# Patient Record
Sex: Male | Born: 1960
Health system: Southern US, Community
[De-identification: ages and names within clinical notes are randomized; demographics above are authoritative.]

## PROBLEM LIST (undated history)

## (undated) DIAGNOSIS — M5136 Other intervertebral disc degeneration, lumbar region: Secondary | ICD-10-CM

## (undated) DIAGNOSIS — B029 Zoster without complications: Secondary | ICD-10-CM

## (undated) DIAGNOSIS — J329 Chronic sinusitis, unspecified: Secondary | ICD-10-CM

## (undated) DIAGNOSIS — G4730 Sleep apnea, unspecified: Secondary | ICD-10-CM

## (undated) DIAGNOSIS — R059 Cough, unspecified: Secondary | ICD-10-CM

## (undated) DIAGNOSIS — U071 COVID-19: Secondary | ICD-10-CM

## (undated) DIAGNOSIS — G4733 Obstructive sleep apnea (adult) (pediatric): Secondary | ICD-10-CM

## (undated) DIAGNOSIS — I519 Heart disease, unspecified: Secondary | ICD-10-CM

## (undated) DIAGNOSIS — N200 Calculus of kidney: Secondary | ICD-10-CM

## (undated) DIAGNOSIS — K760 Fatty (change of) liver, not elsewhere classified: Secondary | ICD-10-CM

## (undated) DIAGNOSIS — Z7289 Other problems related to lifestyle: Secondary | ICD-10-CM

## (undated) DIAGNOSIS — G4700 Insomnia, unspecified: Secondary | ICD-10-CM

## (undated) DIAGNOSIS — H18519 Endothelial corneal dystrophy, unspecified eye: Secondary | ICD-10-CM

## (undated) DIAGNOSIS — E7800 Pure hypercholesterolemia, unspecified: Secondary | ICD-10-CM

## (undated) DIAGNOSIS — R7989 Other specified abnormal findings of blood chemistry: Secondary | ICD-10-CM

## (undated) DIAGNOSIS — M4316 Spondylolisthesis, lumbar region: Secondary | ICD-10-CM

## (undated) DIAGNOSIS — K5900 Constipation, unspecified: Secondary | ICD-10-CM

## (undated) DIAGNOSIS — Z789 Other specified health status: Secondary | ICD-10-CM

## (undated) DIAGNOSIS — K7581 Nonalcoholic steatohepatitis (NASH): Secondary | ICD-10-CM

## (undated) HISTORY — PX: TENDON REPAIR: SHX5111

## (undated) HISTORY — DX: Other problems related to lifestyle: Z72.89

## (undated) HISTORY — DX: Endothelial corneal dystrophy, unspecified eye: H18.519

## (undated) HISTORY — DX: Fatty (change of) liver, not elsewhere classified: K76.0

## (undated) HISTORY — DX: Cough, unspecified: R05.9

## (undated) HISTORY — DX: Other specified abnormal findings of blood chemistry: R79.89

## (undated) HISTORY — DX: Heart disease, unspecified: I51.9

## (undated) HISTORY — DX: Pure hypercholesterolemia, unspecified: E78.00

## (undated) HISTORY — DX: Zoster without complications: B02.9

## (undated) HISTORY — DX: Calculus of kidney: N20.0

## (undated) HISTORY — DX: Other intervertebral disc degeneration, lumbar region: M51.36

## (undated) HISTORY — DX: Other specified health status: Z78.9

## (undated) HISTORY — DX: Obstructive sleep apnea (adult) (pediatric): G47.33

## (undated) HISTORY — DX: COVID-19: U07.1

## (undated) HISTORY — DX: Spondylolisthesis, lumbar region: M43.16

## (undated) HISTORY — DX: Nonalcoholic steatohepatitis (NASH): K75.81

## (undated) HISTORY — DX: Chronic sinusitis, unspecified: J32.9

## (undated) HISTORY — DX: Insomnia, unspecified: G47.00

## (undated) HISTORY — DX: Constipation, unspecified: K59.00

---

## 2000-09-15 ENCOUNTER — Encounter: Admission: RE | Admit: 2000-09-15 | Discharge: 2000-09-15 | Payer: Self-pay | Admitting: Occupational Medicine

## 2000-09-15 ENCOUNTER — Encounter: Payer: Self-pay | Admitting: Occupational Medicine

## 2002-09-27 ENCOUNTER — Encounter: Admission: RE | Admit: 2002-09-27 | Discharge: 2002-09-27 | Payer: Self-pay | Admitting: Family Medicine

## 2002-09-27 ENCOUNTER — Encounter: Payer: Self-pay | Admitting: Family Medicine

## 2004-09-16 ENCOUNTER — Encounter: Admission: RE | Admit: 2004-09-16 | Discharge: 2004-09-16 | Payer: Self-pay | Admitting: Occupational Medicine

## 2006-08-17 ENCOUNTER — Encounter: Admission: RE | Admit: 2006-08-17 | Discharge: 2006-08-17 | Payer: Self-pay | Admitting: Occupational Medicine

## 2006-08-23 ENCOUNTER — Emergency Department (HOSPITAL_COMMUNITY): Admission: EM | Admit: 2006-08-23 | Discharge: 2006-08-23 | Payer: Self-pay | Admitting: Family Medicine

## 2006-11-19 ENCOUNTER — Ambulatory Visit (HOSPITAL_BASED_OUTPATIENT_CLINIC_OR_DEPARTMENT_OTHER): Admission: RE | Admit: 2006-11-19 | Discharge: 2006-11-19 | Payer: Self-pay | Admitting: Urology

## 2006-11-22 ENCOUNTER — Ambulatory Visit (HOSPITAL_COMMUNITY): Admission: RE | Admit: 2006-11-22 | Discharge: 2006-11-22 | Payer: Self-pay | Admitting: Urology

## 2008-08-13 ENCOUNTER — Encounter: Admission: RE | Admit: 2008-08-13 | Discharge: 2008-08-13 | Payer: Self-pay | Admitting: Internal Medicine

## 2010-07-22 NOTE — Op Note (Signed)
NAMEEDWING, FIGLEY           ACCOUNT NO.:  [id number]   MEDICAL RECORD NO.:  [id number]          PATIENT TYPE:  AMB   LOCATION:  [facility]                         FACILITY:  [facility]   PHYSICIAN:  Sigmund I. Patsi Sears, M.D.DATE OF BIRTH:  Jun 05, 1960   DATE OF PROCEDURE:  11/19/2006  DATE OF DISCHARGE:                               OPERATIVE REPORT   PREOPERATIVE DIAGNOSIS:  Impacted left upper ureteral calculus.   POSTOPERATIVE DIAGNOSIS:  Impacted left upper ureteral calculus.   OPERATION:  1. Cystourethroscopy.  2. Left retrograde pyelogram with interpretation.  3. Left double-J catheter (5-French x 24 -m Polaris).   SURGEON:  Sigmund I. Patsi Sears, M.D.   ANESTHESIA:  General LMA.   PREPARATION:  After appropriate preanesthesia, the patient is brought to  the operating room and placed on the operating room in the dorsal supine  position, where general LMA anesthesia was introduced.  He was then  replaced in dorsal lithotomy position, where the pubis was prepped with  Betadine solution and draped in the usual fashion.   REVIEW OF HISTORY:  This 50 year old male has a history of a left upper  ureteral calculus but has had recurrent and continuous kidney colic  overnight and is now for a left double-J catheter insertion.  KUB showed  the 4-5 mm stone, irregularly shaped, in the left upper ureter.   PROCEDURE:  The cystourethroscopy was accomplished and shows a normal-  appearing bladder without evidence of bladder stone, tumor or  diverticular formation.  There was no edema of the orifice.  Left  retrograde pyelograms were therefore performed, which shows a stone in  the left upper ureter with hydronephrosis above it.  Photo documentation  is obtained.  Following this, a guidewire was passed around stone into  the renal pelvis and a 5-French x 24-cm Polaris double-J stent is passed  into the renal pelvis and the bladder.  The stent is in excellent  position.  KUB shows the  stone to now be in the renal pelvis, coiled in  the center of the double-J catheter curl.  The patient tolerated the  procedure well.  He was given a B&O suppository and Toradol at the end  of the procedure.  He is awakened and taken to the recovery room in good  condition.      Sigmund I. Patsi Sears, M.D.  Electronically Signed     SIT/MEDQ  D:  11/19/2006  T:  11/20/2006  Job:  [id number]

## 2010-12-19 LAB — POCT HEMOGLOBIN-HEMACUE
Hemoglobin: 16
Operator id: 11453

## 2010-12-24 LAB — POCT URINALYSIS DIP (DEVICE)
Glucose, UA: NEGATIVE
Ketones, ur: NEGATIVE
Nitrite: NEGATIVE
Operator id: 235561
Protein, ur: 30 — AB
Specific Gravity, Urine: 1.025
Urobilinogen, UA: 0.2
pH: 6

## 2012-01-27 ENCOUNTER — Other Ambulatory Visit: Payer: Self-pay | Admitting: Neurosurgery

## 2012-01-27 DIAGNOSIS — M549 Dorsalgia, unspecified: Secondary | ICD-10-CM

## 2012-01-27 DIAGNOSIS — M5410 Radiculopathy, site unspecified: Secondary | ICD-10-CM

## 2012-02-05 ENCOUNTER — Ambulatory Visit
Admission: RE | Admit: 2012-02-05 | Discharge: 2012-02-05 | Disposition: A | Discharge status: Home / Self Care | Payer: BC Managed Care – PPO | Source: Ambulatory Visit | Attending: Neurosurgery | Admitting: Neurosurgery

## 2012-02-05 VITALS — BP 128/74 | HR 69

## 2012-02-05 DIAGNOSIS — M5410 Radiculopathy, site unspecified: Secondary | ICD-10-CM

## 2012-02-05 DIAGNOSIS — M549 Dorsalgia, unspecified: Secondary | ICD-10-CM

## 2012-02-05 MED ORDER — METHYLPREDNISOLONE ACETATE 40 MG/ML INJ SUSP (RADIOLOG
120.0000 mg | Freq: Once | INTRAMUSCULAR | Status: AC
  Administered 2012-02-05: 120 mg via EPIDURAL

## 2012-02-05 MED ORDER — IOHEXOL 180 MG/ML  SOLN
1.0000 mL | Freq: Once | INTRAMUSCULAR | Status: AC | PRN
  Administered 2012-02-05: 1 mL via EPIDURAL

## 2012-09-04 ENCOUNTER — Encounter (HOSPITAL_COMMUNITY): Payer: Self-pay | Admitting: Emergency Medicine

## 2012-09-04 ENCOUNTER — Emergency Department (INDEPENDENT_AMBULATORY_CARE_PROVIDER_SITE_OTHER): Payer: BC Managed Care – PPO

## 2012-09-04 ENCOUNTER — Emergency Department (INDEPENDENT_AMBULATORY_CARE_PROVIDER_SITE_OTHER)
Admission: EM | Admit: 2012-09-04 | Discharge: 2012-09-04 | Disposition: A | Discharge status: Home / Self Care | Payer: BC Managed Care – PPO | Source: Home / Self Care | Attending: Emergency Medicine | Admitting: Emergency Medicine

## 2012-09-04 DIAGNOSIS — R52 Pain, unspecified: Secondary | ICD-10-CM

## 2012-09-04 DIAGNOSIS — J40 Bronchitis, not specified as acute or chronic: Secondary | ICD-10-CM

## 2012-09-04 DIAGNOSIS — N200 Calculus of kidney: Secondary | ICD-10-CM

## 2012-09-04 DIAGNOSIS — R109 Unspecified abdominal pain: Secondary | ICD-10-CM

## 2012-09-04 HISTORY — DX: Calculus of kidney: N20.0

## 2012-09-04 LAB — POCT I-STAT, CHEM 8
BUN: 24 mg/dL — ABNORMAL HIGH (ref 6–23)
Calcium, Ion: 1.17 mmol/L (ref 1.12–1.23)
Chloride: 100 mEq/L (ref 96–112)
Creatinine, Ser: 1.1 mg/dL (ref 0.50–1.35)
Glucose, Bld: 90 mg/dL (ref 70–99)
HCT: 44 % (ref 39.0–52.0)
Hemoglobin: 15 g/dL (ref 13.0–17.0)
Potassium: 4 mEq/L (ref 3.5–5.1)
Sodium: 140 mEq/L (ref 135–145)
TCO2: 28 mmol/L (ref 0–100)

## 2012-09-04 LAB — POCT URINALYSIS DIP (DEVICE)
Bilirubin Urine: NEGATIVE
Glucose, UA: NEGATIVE mg/dL
Ketones, ur: NEGATIVE mg/dL
Leukocytes, UA: NEGATIVE
Nitrite: NEGATIVE
Protein, ur: 30 mg/dL — AB
Specific Gravity, Urine: >=1.03 (ref 1.005–1.030)
Urobilinogen, UA: 0.2 mg/dL (ref 0.0–1.0)
pH: 6 (ref 5.0–8.0)

## 2012-09-04 MED ORDER — HYDROCODONE-ACETAMINOPHEN 5-325 MG PO TABS: ORAL_TABLET | ORAL | Status: DC

## 2012-09-04 MED ORDER — CEFDINIR 300 MG PO CAPS: 300.0000 mg | ORAL_CAPSULE | Freq: Two times a day (BID) | ORAL | Status: DC

## 2012-09-04 NOTE — ED Notes (Signed)
No instructions.

## 2012-09-04 NOTE — ED Notes (Signed)
Pain in flank/front of torso.  History of kidney stones.  Reports same pain presentation when he had previous kidney stone, but on the left side.  "bladder" pain per patient.   Onset this am at 4:00 am.  Associated with nausea this am.

## 2012-09-04 NOTE — ED Notes (Signed)
MD at bedside. 

## 2012-09-04 NOTE — ED Provider Notes (Signed)
History    CSN: [id number] Arrival date & time 09/04/12  1130  First MD Initiated Contact with Patient 09/04/12 1322     Chief Complaint  Patient presents with  . Flank Pain  PATIENT WOKE UP WITH SEVERE RIGHT FLANK PAIN AT 4 AM TODAY ASSOCIATED URINARY FREQUENCY DENIES FEVER, VOMITING,HEMATURIA HISTORY KIDNEY STONE WHICH HE PASSED 6 YEARS AGO  (Consider location/radiation/quality/duration/timing/severity/associated sxs/prior Treatment) No language interpreter was used.   Past Medical History  Diagnosis Date  . Kidney stone    Past Surgical History  Procedure Laterality Date  . Tendon repair Right     arm   No family history on file. History  Substance Use Topics  . Smoking status: Never Smoker   . Smokeless tobacco: Not on file  . Alcohol Use: Yes    Review of Systems  Constitutional: Negative.   HENT: Negative.   Eyes: Negative.   Respiratory: Positive for cough.   Cardiovascular: Negative.   Gastrointestinal: Positive for abdominal pain.       C/O RIGHT FLANK PAIN  Endocrine: Negative.   Genitourinary: Positive for urgency.  Musculoskeletal: Negative.   Neurological: Negative.   Hematological: Negative.   Psychiatric/Behavioral: Negative.   All other systems reviewed and are negative.    Allergies  Review of patient's allergies indicates no known allergies.  Home Medications   Current Outpatient Rx  Name  Route  Sig  Dispense  Refill  . cefdinir (OMNICEF) 300 MG capsule   Oral   Take 1 capsule (300 mg total) by mouth 2 (two) times daily.   14 capsule   0   . HYDROcodone-acetaminophen (NORCO/VICODIN) 5-325 MG per tablet      1 TO 2 TABS EVERY 4 HOURS PAIN AS NEEDED   15 tablet   0    BP 133/89  Pulse 70  Temp(Src) 97.7 F (36.5 C) (Oral)  Resp 20  SpO2 98% Physical Exam  Nursing note and vitals reviewed. Constitutional: He is oriented to person, place, and time. He appears well-developed and well-nourished.  HENT:  Head:  Normocephalic and atraumatic.  Mouth/Throat: Oropharynx is clear and moist.  Eyes: Conjunctivae are normal. Pupils are equal, round, and reactive to light.  Neck: Normal range of motion. Neck supple.  Cardiovascular: Normal rate, regular rhythm, normal heart sounds and intact distal pulses.   No murmur heard. Pulmonary/Chest: Effort normal and breath sounds normal.  Abdominal: Soft. Bowel sounds are normal. He exhibits no distension and no mass. There is no tenderness.  RIGHT LOWER BACK TENDERNESS NO SWELLING, RASH  Musculoskeletal: Normal range of motion.  Neurological: He is alert and oriented to person, place, and time. No cranial nerve deficit. He exhibits normal muscle tone. Coordination normal.  Skin: Skin is warm and dry.  Psychiatric: He has a normal mood and affect.    ED Course  Procedures (including critical care time) Labs Reviewed  POCT URINALYSIS DIP (DEVICE) - Abnormal; Notable for the following:    Hgb urine dipstick LARGE (*)    Protein, ur 30 (*)    All other components within normal limits  POCT I-STAT, CHEM 8 - Abnormal; Notable for the following:    BUN 24 (*)    All other components within normal limits   Dg Chest 2 View  09/04/2012   *RADIOLOGY REPORT*  Clinical Data: Cough for 2 weeks.  CHEST - 2 VIEW  Comparison: 08/13/2008.  Findings: The heart, mediastinum and hilar contours are normal. The lungs are clear.  There  are no effusions or pneumothoraces. There are no acute bony changes.  IMPRESSION: No active disease   Original Report Authenticated By: Sander Radon, M.D.   1. Acute right flank pain   2. Kidney stone on right side   3. Bronchitis     MDM  Patient claims his previous kidney stone had to be crushed  He agreed to defer CT stone study at this time since pain has resolved Will observe if stone passes spontaneously His present symptoms are consistent with ureteral stone and colic He has microscopic hematuria; renal function normal Advised  to see urologist in 2 days for recheck To go to ER if recurrent and persistent pain  Vara Guardian Marcello Moores, MD 09/05/12 [phone]

## 2012-09-04 NOTE — ED Notes (Signed)
Triage documentation interrupted by department acuity

## 2012-09-04 NOTE — ED Notes (Signed)
Provided with urinary strainer

## 2012-09-15 ENCOUNTER — Ambulatory Visit: Payer: Self-pay

## 2012-09-15 ENCOUNTER — Other Ambulatory Visit: Payer: Self-pay | Admitting: Occupational Medicine

## 2012-09-15 DIAGNOSIS — Z0000 Encounter for general adult medical examination without abnormal findings: Secondary | ICD-10-CM

## 2012-10-20 ENCOUNTER — Other Ambulatory Visit: Payer: Self-pay | Admitting: Neurosurgery

## 2012-10-20 DIAGNOSIS — M549 Dorsalgia, unspecified: Secondary | ICD-10-CM

## 2012-10-26 ENCOUNTER — Other Ambulatory Visit: Payer: Self-pay

## 2012-11-04 ENCOUNTER — Other Ambulatory Visit: Payer: Self-pay

## 2012-11-04 ENCOUNTER — Ambulatory Visit
Admission: RE | Admit: 2012-11-04 | Discharge: 2012-11-04 | Disposition: A | Discharge status: Home / Self Care | Payer: BC Managed Care – PPO | Source: Ambulatory Visit | Attending: Neurosurgery | Admitting: Neurosurgery

## 2012-11-04 VITALS — BP 143/72 | HR 68

## 2012-11-04 DIAGNOSIS — M549 Dorsalgia, unspecified: Secondary | ICD-10-CM

## 2012-11-04 MED ORDER — METHYLPREDNISOLONE ACETATE 40 MG/ML INJ SUSP (RADIOLOG
120.0000 mg | Freq: Once | INTRAMUSCULAR | Status: AC
  Administered 2012-11-04: 120 mg via EPIDURAL

## 2012-11-04 MED ORDER — IOHEXOL 180 MG/ML  SOLN
1.0000 mL | Freq: Once | INTRAMUSCULAR | Status: AC | PRN
  Administered 2012-11-04: 1 mL via EPIDURAL

## 2013-01-12 ENCOUNTER — Other Ambulatory Visit: Payer: Self-pay

## 2013-09-19 ENCOUNTER — Other Ambulatory Visit: Payer: Self-pay | Admitting: Internal Medicine

## 2013-09-19 ENCOUNTER — Ambulatory Visit
Admission: RE | Admit: 2013-09-19 | Discharge: 2013-09-19 | Disposition: A | Discharge status: Home / Self Care | Payer: BC Managed Care – PPO | Source: Ambulatory Visit | Attending: Internal Medicine | Admitting: Internal Medicine

## 2013-09-19 DIAGNOSIS — R059 Cough, unspecified: Secondary | ICD-10-CM

## 2013-09-19 DIAGNOSIS — R05 Cough: Secondary | ICD-10-CM

## 2014-10-04 ENCOUNTER — Other Ambulatory Visit: Payer: Self-pay | Admitting: Occupational Medicine

## 2014-10-04 ENCOUNTER — Ambulatory Visit: Payer: Self-pay

## 2014-10-04 DIAGNOSIS — Z0000 Encounter for general adult medical examination without abnormal findings: Secondary | ICD-10-CM

## 2015-05-07 ENCOUNTER — Other Ambulatory Visit: Payer: Self-pay | Admitting: Neurosurgery

## 2015-05-07 DIAGNOSIS — M549 Dorsalgia, unspecified: Secondary | ICD-10-CM

## 2015-05-13 ENCOUNTER — Ambulatory Visit
Admission: RE | Admit: 2015-05-13 | Discharge: 2015-05-13 | Disposition: A | Discharge status: Home / Self Care | Payer: BLUE CROSS/BLUE SHIELD | Source: Ambulatory Visit | Attending: Neurosurgery | Admitting: Neurosurgery

## 2015-05-13 DIAGNOSIS — M549 Dorsalgia, unspecified: Secondary | ICD-10-CM

## 2015-05-13 MED ORDER — IOHEXOL 180 MG/ML  SOLN
1.0000 mL | Freq: Once | INTRAMUSCULAR | Status: AC | PRN
  Administered 2015-05-13: 1 mL via EPIDURAL

## 2015-05-13 MED ORDER — METHYLPREDNISOLONE ACETATE 40 MG/ML INJ SUSP (RADIOLOG
120.0000 mg | Freq: Once | INTRAMUSCULAR | Status: AC
  Administered 2015-05-13: 120 mg via EPIDURAL

## 2015-05-13 NOTE — Discharge Instructions (Signed)

## 2015-08-13 DIAGNOSIS — B029 Zoster without complications: Secondary | ICD-10-CM | POA: Diagnosis not present

## 2015-09-26 DIAGNOSIS — Z125 Encounter for screening for malignant neoplasm of prostate: Secondary | ICD-10-CM | POA: Diagnosis not present

## 2015-09-26 DIAGNOSIS — E663 Overweight: Secondary | ICD-10-CM | POA: Diagnosis not present

## 2015-09-26 DIAGNOSIS — Z0000 Encounter for general adult medical examination without abnormal findings: Secondary | ICD-10-CM | POA: Diagnosis not present

## 2015-11-26 DIAGNOSIS — M26609 Unspecified temporomandibular joint disorder, unspecified side: Secondary | ICD-10-CM | POA: Insufficient documentation

## 2016-04-15 DIAGNOSIS — L02439 Carbuncle of limb, unspecified: Secondary | ICD-10-CM | POA: Diagnosis not present

## 2016-04-29 DIAGNOSIS — J069 Acute upper respiratory infection, unspecified: Secondary | ICD-10-CM | POA: Diagnosis not present

## 2016-06-12 DIAGNOSIS — L02412 Cutaneous abscess of left axilla: Secondary | ICD-10-CM | POA: Diagnosis not present

## 2016-08-08 ENCOUNTER — Emergency Department (HOSPITAL_COMMUNITY)
Admission: EM | Admit: 2016-08-08 | Discharge: 2016-08-08 | Disposition: A | Discharge status: Home / Self Care | Payer: BLUE CROSS/BLUE SHIELD | Attending: Emergency Medicine | Admitting: Emergency Medicine

## 2016-08-08 ENCOUNTER — Emergency Department (HOSPITAL_COMMUNITY): Payer: BLUE CROSS/BLUE SHIELD

## 2016-08-08 ENCOUNTER — Encounter (HOSPITAL_COMMUNITY): Payer: Self-pay

## 2016-08-08 DIAGNOSIS — R1032 Left lower quadrant pain: Secondary | ICD-10-CM | POA: Diagnosis present

## 2016-08-08 DIAGNOSIS — N200 Calculus of kidney: Secondary | ICD-10-CM

## 2016-08-08 DIAGNOSIS — N132 Hydronephrosis with renal and ureteral calculous obstruction: Secondary | ICD-10-CM | POA: Diagnosis not present

## 2016-08-08 HISTORY — DX: Calculus of kidney: N20.0

## 2016-08-08 LAB — URINALYSIS, ROUTINE W REFLEX MICROSCOPIC
Bilirubin Urine: NEGATIVE
Bilirubin Urine: NEGATIVE
Glucose, UA: NEGATIVE mg/dL
Glucose, UA: NEGATIVE mg/dL
Ketones, ur: NEGATIVE mg/dL
Ketones, ur: NEGATIVE mg/dL
Leukocytes, UA: NEGATIVE
Leukocytes, UA: NEGATIVE
Nitrite: NEGATIVE
Nitrite: NEGATIVE
Protein, ur: 100 mg/dL — AB
Protein, ur: 100 mg/dL — AB
Specific Gravity, Urine: 1.02 (ref 1.005–1.030)
Specific Gravity, Urine: 1.02 (ref 1.005–1.030)
pH: 5 (ref 5.0–8.0)
pH: 5 (ref 5.0–8.0)

## 2016-08-08 LAB — URINALYSIS, MICROSCOPIC (REFLEX): Squamous Epithelial / LPF: NONE SEEN

## 2016-08-08 LAB — COMPREHENSIVE METABOLIC PANEL
ALT: 165 U/L — ABNORMAL HIGH (ref 17–63)
AST: 89 U/L — ABNORMAL HIGH (ref 15–41)
Albumin: 4.5 g/dL (ref 3.5–5.0)
Alkaline Phosphatase: 64 U/L (ref 38–126)
Anion gap: 7 (ref 5–15)
BUN: 21 mg/dL — ABNORMAL HIGH (ref 6–20)
CO2: 26 mmol/L (ref 22–32)
Calcium: 9.5 mg/dL (ref 8.9–10.3)
Chloride: 105 mmol/L (ref 101–111)
Creatinine, Ser: 1.04 mg/dL (ref 0.61–1.24)
GFR calc Af Amer: >60 mL/min (ref >60)
GFR calc non Af Amer: >60 mL/min (ref >60)
Glucose, Bld: 118 mg/dL — ABNORMAL HIGH (ref 65–99)
Potassium: 3.9 mmol/L (ref 3.5–5.1)
Sodium: 138 mmol/L (ref 135–145)
Total Bilirubin: 0.8 mg/dL (ref 0.3–1.2)
Total Protein: 8.1 g/dL (ref 6.5–8.1)

## 2016-08-08 LAB — CBC WITH DIFFERENTIAL/PLATELET
Basophils Absolute: 0 10*3/uL (ref 0.0–0.1)
Basophils Relative: 0 %
Eosinophils Absolute: 0.1 10*3/uL (ref 0.0–0.7)
Eosinophils Relative: 1 %
HCT: 42.1 % (ref 39.0–52.0)
Hemoglobin: 15.3 g/dL (ref 13.0–17.0)
Lymphocytes Relative: 17 %
Lymphs Abs: 1.6 10*3/uL (ref 0.7–4.0)
MCH: 32.6 pg (ref 26.0–34.0)
MCHC: 36.3 g/dL — ABNORMAL HIGH (ref 30.0–36.0)
MCV: 89.8 fL (ref 78.0–100.0)
Monocytes Absolute: 0.5 10*3/uL (ref 0.1–1.0)
Monocytes Relative: 5 %
Neutro Abs: 7.4 10*3/uL (ref 1.7–7.7)
Neutrophils Relative %: 77 %
Platelets: 198 10*3/uL (ref 150–400)
RBC: 4.69 MIL/uL (ref 4.22–5.81)
RDW: 12.2 % (ref 11.5–15.5)
WBC: 9.6 10*3/uL (ref 4.0–10.5)

## 2016-08-08 MED ORDER — HYDROMORPHONE HCL 1 MG/ML IJ SOLN
1.0000 mg | Freq: Two times a day (BID) | INTRAMUSCULAR | Status: DC | PRN
  Administered 2016-08-08: 1 mg via INTRAVENOUS
  Filled 2016-08-08: qty 1

## 2016-08-08 MED ORDER — ONDANSETRON HCL 4 MG/2ML IJ SOLN
4.0000 mg | Freq: Once | INTRAMUSCULAR | Status: AC
  Administered 2016-08-08: 4 mg via INTRAVENOUS
  Filled 2016-08-08: qty 2

## 2016-08-08 MED ORDER — SODIUM CHLORIDE 0.9 % IV BOLUS (SEPSIS)
1000.0000 mL | Freq: Once | INTRAVENOUS | Status: AC
  Administered 2016-08-08: 1000 mL via INTRAVENOUS

## 2016-08-08 MED ORDER — OXYCODONE-ACETAMINOPHEN 5-325 MG PO TABS
1.0000 | ORAL_TABLET | Freq: Once | ORAL | Status: AC
  Administered 2016-08-08: 1 via ORAL
  Filled 2016-08-08: qty 1

## 2016-08-08 MED ORDER — OXYCODONE-ACETAMINOPHEN 5-325 MG PO TABS
2.0000 | ORAL_TABLET | ORAL | 0 refills | Status: DC | PRN
Start: 2016-08-08 — End: 2017-03-19

## 2016-08-08 MED ORDER — ONDANSETRON 4 MG PO TBDP: 4.0000 mg | ORAL_TABLET | Freq: Three times a day (TID) | ORAL | 0 refills | Status: DC | PRN

## 2016-08-08 MED ORDER — TAMSULOSIN HCL 0.4 MG PO CAPS: 0.4000 mg | ORAL_CAPSULE | Freq: Every day | ORAL | 0 refills | Status: DC

## 2016-08-08 MED ORDER — TAMSULOSIN HCL 0.4 MG PO CAPS
0.4000 mg | ORAL_CAPSULE | Freq: Once | ORAL | Status: AC
  Administered 2016-08-08: 0.4 mg via ORAL
  Filled 2016-08-08: qty 1

## 2016-08-08 MED ORDER — KETOROLAC TROMETHAMINE 30 MG/ML IJ SOLN
30.0000 mg | Freq: Once | INTRAMUSCULAR | Status: AC
  Administered 2016-08-08: 30 mg via INTRAVENOUS
  Filled 2016-08-08: qty 1

## 2016-08-08 NOTE — ED Triage Notes (Signed)
Pt had sudden onset pain in lower pelvic/abdomen which radiates to groin. Nausea.  Pt states hx of kidney stones. Feels similar

## 2016-08-08 NOTE — ED Provider Notes (Signed)
[facility]-EMERGENCY DEPT Provider Note   CSN: [id number] Arrival date & time: 08/08/16  1744     History   Chief Complaint Chief Complaint  Patient presents with  . Groin Pain    HPI Wayne Forbes is a 56 y.o. male. Chief complaint is left flank and abdominal pain.  HPI: This is a 56 year old male who developed sudden onset of left flank and left mid abdominal pain at 3:00 this afternoon. Previous history of ureteral stones. Past one at home. Had lithotripsy and stent for the other. Nausea no vomiting. Crescendoing in the crescendoing pain. States his pain right now as a 6. Has been 10/10 at times today.  Past Medical History:  Diagnosis Date  . Kidney stone   . Kidney stones     There are no active problems to display for this patient.   Past Surgical History:  Procedure Laterality Date  . TENDON REPAIR Right    arm       Home Medications    Prior to Admission medications   Medication Sig Start Date End Date Taking? Authorizing Provider  cefdinir (OMNICEF) 300 MG capsule Take 1 capsule (300 mg total) by mouth 2 (two) times daily. 09/04/12   [place], Duwayne Heck, MD  HYDROcodone-acetaminophen (NORCO/VICODIN) 5-325 MG per tablet 1 TO 2 TABS EVERY 4 HOURS PAIN AS NEEDED 09/04/12   [place], Duwayne Heck, MD  ondansetron (ZOFRAN ODT) 4 MG disintegrating tablet Take 1 tablet (4 mg total) by mouth every 8 (eight) hours as needed for nausea. 08/08/16   Rolland Porter, MD  oxyCODONE-acetaminophen (PERCOCET/ROXICET) 5-325 MG tablet Take 2 tablets by mouth every 4 (four) hours as needed. 08/08/16   Rolland Porter, MD  tamsulosin (FLOMAX) 0.4 MG CAPS capsule Take 1 capsule (0.4 mg total) by mouth daily. 08/08/16   Rolland Porter, MD    Family History History reviewed. No pertinent family history.  Social History Social History  Substance Use Topics  . Smoking status: Never Smoker  . Smokeless tobacco: Never Used  . Alcohol use Yes     Allergies   Patient has no known  allergies.   Review of Systems Review of Systems  Constitutional: Negative for appetite change, chills, diaphoresis, fatigue and fever.  HENT: Negative for mouth sores, sore throat and trouble swallowing.   Eyes: Negative for visual disturbance.  Respiratory: Negative for cough, chest tightness, shortness of breath and wheezing.   Cardiovascular: Negative for chest pain.  Gastrointestinal: Positive for abdominal pain and nausea. Negative for abdominal distention, diarrhea and vomiting.  Endocrine: Negative for polydipsia, polyphagia and polyuria.  Genitourinary: Negative for dysuria, frequency and hematuria.  Musculoskeletal: Negative for gait problem.  Skin: Negative for color change, pallor and rash.  Neurological: Negative for dizziness, syncope, light-headedness and headaches.  Hematological: Does not bruise/bleed easily.  Psychiatric/Behavioral: Negative for behavioral problems and confusion.     Physical Exam Updated Vital Signs BP (!) 135/93 (BP Location: Left Arm)   Pulse 70   Temp 97.7 F (36.5 C) (Oral)   Resp 20   SpO2 100%   Physical Exam  Constitutional: He is oriented to person, place, and time. He appears well-developed and well-nourished. No distress.  HENT:  Head: Normocephalic.  Eyes: Conjunctivae are normal. Pupils are equal, round, and reactive to light. No scleral icterus.  Neck: Normal range of motion. Neck supple. No thyromegaly present.  Cardiovascular: Normal rate and regular rhythm.  Exam reveals no gallop and no friction rub.   No  murmur heard. Pulmonary/Chest: Effort normal and breath sounds normal. No respiratory distress. He has no wheezes. He has no rales.  Abdominal: Soft. Bowel sounds are normal. He exhibits no distension. There is no tenderness. There is no rebound.    Musculoskeletal: Normal range of motion.  Neurological: He is alert and oriented to person, place, and time.  Skin: Skin is warm and dry. No rash noted.  Psychiatric: He  has a normal mood and affect. His behavior is normal.     ED Treatments / Results  Labs (all labs ordered are listed, but only abnormal results are displayed) Labs Reviewed  URINALYSIS, ROUTINE W REFLEX MICROSCOPIC - Abnormal; Notable for the following:       Result Value   APPearance CLOUDY (*)    Hgb urine dipstick LARGE (*)    Protein, ur 100 (*)    All other components within normal limits  URINALYSIS, ROUTINE W REFLEX MICROSCOPIC - Abnormal; Notable for the following:    Color, Urine BROWN (*)    APPearance TURBID (*)    Hgb urine dipstick LARGE (*)    Protein, ur 100 (*)    All other components within normal limits  URINALYSIS, MICROSCOPIC (REFLEX) - Abnormal; Notable for the following:    Bacteria, UA MANY (*)    All other components within normal limits  CBC WITH DIFFERENTIAL/PLATELET - Abnormal; Notable for the following:    MCHC 36.3 (*)    All other components within normal limits  COMPREHENSIVE METABOLIC PANEL - Abnormal; Notable for the following:    Glucose, Bld 118 (*)    BUN 21 (*)    AST 89 (*)    ALT 165 (*)    All other components within normal limits    EKG  EKG Interpretation None       Radiology Ct Abdomen Pelvis Wo Contrast  Result Date: 08/08/2016 CLINICAL DATA:  Acute onset of lower pelvic and abdominal pain, radiating to the groin. Nausea. Initial encounter. EXAM: CT ABDOMEN AND PELVIS WITHOUT CONTRAST TECHNIQUE: Multidetector CT imaging of the abdomen and pelvis was performed following the standard protocol without IV contrast. COMPARISON:  CT of the abdomen and pelvis performed 09/06/2012, and lumbar spine MRI performed 04/20/2015 FINDINGS: Lower chest: The visualized lung bases are grossly clear. The visualized portions of the mediastinum are unremarkable. Hepatobiliary: The liver is unremarkable in appearance. The gallbladder is unremarkable in appearance. The common bile duct remains normal in caliber. Pancreas: The pancreas is within normal  limits. Spleen: Numerous calcified granulomata are seen within the spleen. Adrenals/Urinary Tract: There is minimal left-sided hydronephrosis, with mild left-sided perinephric stranding, and an obstructing 5 x 4 mm stone noted at the proximal left ureter, 4 cm below the left renal pelvis. Nonobstructing bilateral renal stones measure up to 6 mm in size. Minimal nonspecific right-sided perinephric stranding is noted. Stomach/Bowel: The stomach is unremarkable in appearance. The small bowel is within normal limits. The appendix is normal in caliber, without evidence of appendicitis. The colon is unremarkable in appearance. Vascular/Lymphatic: Minimal calcification is noted at the distal abdominal aorta. No retroperitoneal or pelvic sidewall lymphadenopathy is seen. Reproductive: The bladder is mildly distended and grossly unremarkable. The prostate remains normal in size, with minimal calcification. Other: No additional soft tissue abnormalities are seen. Musculoskeletal: No acute osseous abnormalities are identified. Vacuum phenomenon is noted at L5-S1, with mild grade 1 retrolisthesis of L5 on S1. The visualized musculature is unremarkable in appearance. IMPRESSION: 1. Minimal left-sided hydronephrosis, with obstructing 5  x 4 mm stone noted at the proximal left ureter, 4 cm below the left renal pelvis. 2. Nonobstructing bilateral renal stones measure up to 6 mm in size. Electronically Signed   By: Roanna RaiderJeffery  Chang M.D.   On: 08/08/2016 22:06    Procedures Procedures (including critical care time)  Medications Ordered in ED Medications  HYDROmorphone (DILAUDID) injection 1 mg (1 mg Intravenous Given 08/08/16 2124)  ketorolac (TORADOL) 30 MG/ML injection 30 mg (not administered)  oxyCODONE-acetaminophen (PERCOCET/ROXICET) 5-325 MG per tablet 1 tablet (not administered)  ondansetron (ZOFRAN) injection 4 mg (4 mg Intravenous Given 08/08/16 2124)  tamsulosin (FLOMAX) capsule 0.4 mg (0.4 mg Oral Given 08/08/16 2125)   sodium chloride 0.9 % bolus 1,000 mL (1,000 mLs Intravenous New Bag/Given 08/08/16 2124)     Initial Impression / Assessment and Plan / ED Course  I have reviewed the triage vital signs and the nursing notes.  Pertinent labs & imaging results that were available during my care of the patient were reviewed by me and considered in my medical decision making (see chart for details).     Urine shows hematuria. He has no fever, leukocytosis, or marked blood cell count of his urine. CT scan shows 4 x 5 mm left proximal ureteral stone. I displaced. Given IV fluids. His renal function is normal. Given Toradol 30 mg IV. Given Dilaudid and Zofran IV. Given by mouth Flomax. As a regular of states his pain is "gone". I discussed care and treatment for stones. Recommend urological follow-up within the week if not resolving. Otherwise home, push fluids, by mouth pain meds, Flomax, return to ER with worsening pain, fever, vomiting, or other symptoms.  Final Clinical Impressions(s) / ED Diagnoses   Final diagnoses:  Kidney stone    New Prescriptions New Prescriptions   ONDANSETRON (ZOFRAN ODT) 4 MG DISINTEGRATING TABLET    Take 1 tablet (4 mg total) by mouth every 8 (eight) hours as needed for nausea.   OXYCODONE-ACETAMINOPHEN (PERCOCET/ROXICET) 5-325 MG TABLET    Take 2 tablets by mouth every 4 (four) hours as needed.   TAMSULOSIN (FLOMAX) 0.4 MG CAPS CAPSULE    Take 1 capsule (0.4 mg total) by mouth daily.     Rolland PorterJames, [place], MD 08/08/16 2231

## 2016-08-08 NOTE — Discharge Instructions (Signed)
Push fluids. Percocet for pain. Zofran for nausea. Call Urologist, Dr. Sherryl BartersBudzyn at [facility] Next week if not resolving. Return to ER with uncontrolled pain, fever, vomiting, worsening.

## 2016-08-12 DIAGNOSIS — N201 Calculus of ureter: Secondary | ICD-10-CM | POA: Diagnosis not present

## 2016-08-18 DIAGNOSIS — R300 Dysuria: Secondary | ICD-10-CM | POA: Diagnosis not present

## 2016-08-26 DIAGNOSIS — N202 Calculus of kidney with calculus of ureter: Secondary | ICD-10-CM | POA: Diagnosis not present

## 2016-08-27 DIAGNOSIS — N201 Calculus of ureter: Secondary | ICD-10-CM | POA: Diagnosis not present

## 2017-01-05 DIAGNOSIS — R05 Cough: Secondary | ICD-10-CM | POA: Diagnosis not present

## 2017-01-05 DIAGNOSIS — R0789 Other chest pain: Secondary | ICD-10-CM | POA: Diagnosis not present

## 2017-01-21 DIAGNOSIS — R739 Hyperglycemia, unspecified: Secondary | ICD-10-CM | POA: Diagnosis not present

## 2017-01-21 DIAGNOSIS — R945 Abnormal results of liver function studies: Secondary | ICD-10-CM | POA: Diagnosis not present

## 2017-02-18 DIAGNOSIS — R945 Abnormal results of liver function studies: Secondary | ICD-10-CM | POA: Diagnosis not present

## 2017-02-18 DIAGNOSIS — R739 Hyperglycemia, unspecified: Secondary | ICD-10-CM | POA: Diagnosis not present

## 2017-02-19 DIAGNOSIS — R7989 Other specified abnormal findings of blood chemistry: Secondary | ICD-10-CM | POA: Diagnosis not present

## 2017-02-22 ENCOUNTER — Other Ambulatory Visit: Payer: Self-pay | Admitting: Internal Medicine

## 2017-02-22 DIAGNOSIS — R7989 Other specified abnormal findings of blood chemistry: Secondary | ICD-10-CM

## 2017-02-22 DIAGNOSIS — R945 Abnormal results of liver function studies: Principal | ICD-10-CM

## 2017-02-24 DIAGNOSIS — N200 Calculus of kidney: Secondary | ICD-10-CM | POA: Diagnosis not present

## 2017-02-26 ENCOUNTER — Ambulatory Visit
Admission: RE | Admit: 2017-02-26 | Discharge: 2017-02-26 | Disposition: A | Discharge status: Home / Self Care | Payer: BLUE CROSS/BLUE SHIELD | Source: Ambulatory Visit | Attending: Internal Medicine | Admitting: Internal Medicine

## 2017-02-26 DIAGNOSIS — R945 Abnormal results of liver function studies: Secondary | ICD-10-CM | POA: Diagnosis not present

## 2017-02-26 DIAGNOSIS — R7989 Other specified abnormal findings of blood chemistry: Secondary | ICD-10-CM

## 2017-03-05 DIAGNOSIS — K760 Fatty (change of) liver, not elsewhere classified: Secondary | ICD-10-CM | POA: Diagnosis not present

## 2017-03-05 DIAGNOSIS — R7989 Other specified abnormal findings of blood chemistry: Secondary | ICD-10-CM | POA: Diagnosis not present

## 2017-03-09 DIAGNOSIS — K760 Fatty (change of) liver, not elsewhere classified: Secondary | ICD-10-CM

## 2017-03-09 HISTORY — DX: Fatty (change of) liver, not elsewhere classified: K76.0

## 2017-03-10 ENCOUNTER — Other Ambulatory Visit (HOSPITAL_COMMUNITY): Payer: Self-pay | Admitting: Physician Assistant

## 2017-03-10 DIAGNOSIS — R7989 Other specified abnormal findings of blood chemistry: Secondary | ICD-10-CM

## 2017-03-10 DIAGNOSIS — R945 Abnormal results of liver function studies: Principal | ICD-10-CM

## 2017-03-16 ENCOUNTER — Other Ambulatory Visit: Payer: Self-pay | Admitting: Radiology

## 2017-03-18 ENCOUNTER — Other Ambulatory Visit: Payer: Self-pay | Admitting: Radiology

## 2017-03-19 ENCOUNTER — Ambulatory Visit (HOSPITAL_COMMUNITY)
Admission: RE | Admit: 2017-03-19 | Discharge: 2017-03-19 | Disposition: A | Discharge status: Home / Self Care | Payer: BLUE CROSS/BLUE SHIELD | Source: Ambulatory Visit | Attending: Physician Assistant | Admitting: Physician Assistant

## 2017-03-19 ENCOUNTER — Encounter (HOSPITAL_COMMUNITY): Payer: Self-pay

## 2017-03-19 DIAGNOSIS — R945 Abnormal results of liver function studies: Secondary | ICD-10-CM | POA: Diagnosis not present

## 2017-03-19 DIAGNOSIS — K7589 Other specified inflammatory liver diseases: Secondary | ICD-10-CM | POA: Diagnosis not present

## 2017-03-19 DIAGNOSIS — K760 Fatty (change of) liver, not elsewhere classified: Secondary | ICD-10-CM | POA: Diagnosis not present

## 2017-03-19 DIAGNOSIS — R7989 Other specified abnormal findings of blood chemistry: Secondary | ICD-10-CM

## 2017-03-19 LAB — CBC WITH DIFFERENTIAL/PLATELET
Basophils Absolute: 0 10*3/uL (ref 0.0–0.1)
Basophils Relative: 0 %
Eosinophils Absolute: 0.1 10*3/uL (ref 0.0–0.7)
Eosinophils Relative: 2 %
HCT: 43.2 % (ref 39.0–52.0)
Hemoglobin: 15.6 g/dL (ref 13.0–17.0)
Lymphocytes Relative: 32 %
Lymphs Abs: 2 10*3/uL (ref 0.7–4.0)
MCH: 33.5 pg (ref 26.0–34.0)
MCHC: 36.1 g/dL — ABNORMAL HIGH (ref 30.0–36.0)
MCV: 92.7 fL (ref 78.0–100.0)
Monocytes Absolute: 0.6 10*3/uL (ref 0.1–1.0)
Monocytes Relative: 10 %
Neutro Abs: 3.4 10*3/uL (ref 1.7–7.7)
Neutrophils Relative %: 56 %
Platelets: 189 10*3/uL (ref 150–400)
RBC: 4.66 MIL/uL (ref 4.22–5.81)
RDW: 11.7 % (ref 11.5–15.5)
WBC: 6.1 10*3/uL (ref 4.0–10.5)

## 2017-03-19 LAB — PROTIME-INR
INR: 1.02
Prothrombin Time: 13.4 seconds (ref 11.4–15.2)

## 2017-03-19 LAB — COMPREHENSIVE METABOLIC PANEL
ALT: 190 U/L — ABNORMAL HIGH (ref 17–63)
AST: 102 U/L — ABNORMAL HIGH (ref 15–41)
Albumin: 4.4 g/dL (ref 3.5–5.0)
Alkaline Phosphatase: 53 U/L (ref 38–126)
Anion gap: 6 (ref 5–15)
BUN: 18 mg/dL (ref 6–20)
CO2: 25 mmol/L (ref 22–32)
Calcium: 9.3 mg/dL (ref 8.9–10.3)
Chloride: 105 mmol/L (ref 101–111)
Creatinine, Ser: 0.94 mg/dL (ref 0.61–1.24)
GFR calc Af Amer: >60 mL/min (ref >60)
GFR calc non Af Amer: >60 mL/min (ref >60)
Glucose, Bld: 102 mg/dL — ABNORMAL HIGH (ref 65–99)
Potassium: 4.3 mmol/L (ref 3.5–5.1)
Sodium: 136 mmol/L (ref 135–145)
Total Bilirubin: 0.8 mg/dL (ref 0.3–1.2)
Total Protein: 7.9 g/dL (ref 6.5–8.1)

## 2017-03-19 MED ORDER — FENTANYL CITRATE (PF) 100 MCG/2ML IJ SOLN
INTRAMUSCULAR | Status: AC | PRN
  Administered 2017-03-19 (×2): 50 ug via INTRAVENOUS

## 2017-03-19 MED ORDER — FENTANYL CITRATE (PF) 100 MCG/2ML IJ SOLN
INTRAMUSCULAR | Status: AC
  Filled 2017-03-19: qty 2

## 2017-03-19 MED ORDER — OXYCODONE HCL 5 MG PO TABS
5.0000 mg | ORAL_TABLET | ORAL | Status: DC | PRN
  Filled 2017-03-19: qty 1

## 2017-03-19 MED ORDER — SODIUM CHLORIDE 0.9 % IV SOLN
INTRAVENOUS | Status: DC
  Administered 2017-03-19: 11:00:00 via INTRAVENOUS

## 2017-03-19 MED ORDER — LIDOCAINE HCL 1 % IJ SOLN
INTRAMUSCULAR | Status: AC
  Filled 2017-03-19: qty 10

## 2017-03-19 MED ORDER — MIDAZOLAM HCL 2 MG/2ML IJ SOLN
INTRAMUSCULAR | Status: AC
  Filled 2017-03-19: qty 2

## 2017-03-19 MED ORDER — MIDAZOLAM HCL 2 MG/2ML IJ SOLN
INTRAMUSCULAR | Status: AC | PRN
  Administered 2017-03-19: 2 mg via INTRAVENOUS

## 2017-03-19 NOTE — Discharge Instructions (Signed)
Moderate Conscious Sedation, Adult, Care After These instructions provide you with information about caring for yourself after your procedure. Your health care provider may also give you more specific instructions. Your treatment has been planned according to current medical practices, but problems sometimes occur. Call your health care provider if you have any problems or questions after your procedure. What can I expect after the procedure? After your procedure, it is common:  To feel sleepy for several hours.  To feel clumsy and have poor balance for several hours.  To have poor judgment for several hours.  To vomit if you eat too soon.  Follow these instructions at home: For at least 24 hours after the procedure:   Do not: ? Participate in activities where you could fall or become injured. ? Drive. ? Use heavy machinery. ? Drink alcohol. ? Take sleeping pills or medicines that cause drowsiness. ? Make important decisions or sign legal documents. ? Take care of children on your own.  Rest. Eating and drinking  Follow the diet recommended by your health care provider.  If you vomit: ? Drink water, juice, or soup when you can drink without vomiting. ? Make sure you have little or no nausea before eating solid foods. General instructions  Have a responsible adult stay with you until you are awake and alert.  Take over-the-counter and prescription medicines only as told by your health care provider.  If you smoke, do not smoke without supervision.  Keep all follow-up visits as told by your health care provider. This is important. Contact a health care provider if:  You keep feeling nauseous or you keep vomiting.  You feel light-headed.  You develop a rash.  You have a fever. Get help right away if:  You have trouble breathing. This information is not intended to replace advice given to you by your health care provider. Make sure you discuss any questions you have  with your health care provider. Document Released: 12/14/2012 Document Revised: 07/29/2015 Document Reviewed: 06/15/2015 Elsevier Interactive Patient Education  2018 [employer]   Liver Biopsy, Care After These instructions give you information on caring for yourself after your procedure. Your doctor may also give you more specific instructions. Call your doctor if you have any problems or questions after your procedure. Follow these instructions at home:  Rest at home for 1-2 days or as told by your doctor.  Have someone stay with you for at least 24 hours.  Do not do these things in the first 24 hours: ? Drive. ? Use machinery. ? Take care of other people. ? Sign legal documents. ? Take a bath or shower.  There are many different ways to close and cover a cut (incision). For example, a cut can be closed with stitches, skin glue, or adhesive strips. Follow your doctor's instructions on: ? Taking care of your cut. ? Changing and removing your bandage (dressing).  You may remove your dressing tomorrow. ? Removing whatever was used to close your cut.  Do not drink alcohol in the first week.  Do not lift more than 5 pounds or play contact sports for the first 2 weeks.  Take medicines only as told by your doctor. For 1 week, do not take medicine that has aspirin.  Get your test results. Contact a doctor if:  A cut bleeds and leaves more than just a small spot of blood.  A cut is red, puffs up (swells), or hurts more than before.  Fluid or something  else comes from a cut.  A cut smells bad.  You have a fever or chills. Get help right away if:  You have swelling, bloating, or pain in your belly (abdomen).  You get dizzy or faint.  You have a rash.  You feel sick to your stomach (nauseous) or throw up (vomit).  You have trouble breathing, feel short of breath, or feel faint.  Your chest hurts.  You have problems talking or seeing.  You have trouble balancing or  moving your arms or legs. This information is not intended to replace advice given to you by your health care provider. Make sure you discuss any questions you have with your health care provider. Document Released: 12/03/2007 Document Revised: 08/01/2015 Document Reviewed: 04/21/2013 Elsevier Interactive Patient Education  [employer].

## 2017-03-19 NOTE — Procedures (Signed)
US guided core biopsies from right hepatic lobe.  3 cores obtained. Minimal blood loss.  No immediate complication.

## 2017-03-19 NOTE — H&P (Signed)
Chief Complaint: elevated LFTs  Referring Physician: Deliah Goody, PA-C  Supervising Physician: Markus Daft  Patient Status: [facility] - Out-pt  HPI: Wayne Forbes is a 57 y.o. male who is otherwise healthy who was noted to have elevated LFTs on his routine CMET with his pcp.  He was referred to GI and ultimately sent for a random liver biopsy for which he presents today.  He has no complaints of chest pain, shortness of breath, fevers, chills, etc.  Past Medical History:  Past Medical History:  Diagnosis Date  . Kidney stone   . Kidney stones     Past Surgical History:  Past Surgical History:  Procedure Laterality Date  . TENDON REPAIR Right    arm    Family History: History reviewed. No pertinent family history.  Social History:  reports that  has never smoked. he has never used smokeless tobacco. He reports that he drinks alcohol. He reports that he does not use drugs.  Allergies: No Known Allergies  Medications: Medications reviewed in epic  Please HPI for pertinent positives, otherwise complete 10 system ROS negative.  Mallampati Score: MD Evaluation Airway: WNL Heart: WNL Abdomen: WNL Chest/ Lungs: WNL ASA  Classification: 1 Mallampati/Airway Score: Two  Physical Exam: Temp (F)   97.8  97.8 (36.6)  01/11 1042  Pulse Rate   73  73  01/11 1042  Resp   18  18  01/11 1042  BP   137/84  137/84  01/11 1042  SpO2 (%)   98  98  01/11 1042    General: pleasant, WD, WN white male who is laying in bed in NAD HEENT: head is normocephalic, atraumatic.  Sclera are noninjected.  PERRL.  Ears and nose without any masses or lesions.  Mouth is pink and moist Heart: regular, rate, and rhythm.  Normal s1,s2. No obvious murmurs, gallops, or rubs noted.  Palpable radial and pedal pulses bilaterally Lungs: CTAB, no wheezes, rhonchi, or rales noted.  Respiratory effort nonlabored Abd: soft, NT, ND, +BS, no masses, hernias, or organomegaly Psych: A&Ox3 with an  appropriate affect.   Labs: Results for orders placed or performed during the hospital encounter of 03/19/17 (from the past 48 hour(s))  CBC with Differential/Platelet     Status: Abnormal   Collection Time: 03/19/17 10:47 AM  Result Value Ref Range   WBC 6.1 4.0 - 10.5 K/uL   RBC 4.66 4.22 - 5.81 MIL/uL   Hemoglobin 15.6 13.0 - 17.0 g/dL   HCT 43.2 39.0 - 52.0 %   MCV 92.7 78.0 - 100.0 fL   MCH 33.5 26.0 - 34.0 pg   MCHC 36.1 (H) 30.0 - 36.0 g/dL   RDW 11.7 11.5 - 15.5 %   Platelets 189 150 - 400 K/uL   Neutrophils Relative % 56 %   Neutro Abs 3.4 1.7 - 7.7 K/uL   Lymphocytes Relative 32 %   Lymphs Abs 2.0 0.7 - 4.0 K/uL   Monocytes Relative 10 %   Monocytes Absolute 0.6 0.1 - 1.0 K/uL   Eosinophils Relative 2 %   Eosinophils Absolute 0.1 0.0 - 0.7 K/uL   Basophils Relative 0 %   Basophils Absolute 0.0 0.0 - 0.1 K/uL  Comprehensive metabolic panel     Status: Abnormal   Collection Time: 03/19/17 10:47 AM  Result Value Ref Range   Sodium 136 135 - 145 mmol/L   Potassium 4.3 3.5 - 5.1 mmol/L   Chloride 105 101 - 111 mmol/L  CO2 25 22 - 32 mmol/L   Glucose, Bld 102 (H) 65 - 99 mg/dL   BUN 18 6 - 20 mg/dL   Creatinine, Ser 0.94 0.61 - 1.24 mg/dL   Calcium 9.3 8.9 - 10.3 mg/dL   Total Protein 7.9 6.5 - 8.1 g/dL   Albumin 4.4 3.5 - 5.0 g/dL   AST 102 (H) 15 - 41 U/L   ALT 190 (H) 17 - 63 U/L   Alkaline Phosphatase 53 38 - 126 U/L   Total Bilirubin 0.8 0.3 - 1.2 mg/dL   GFR calc non Af Amer >60 >60 mL/min   GFR calc Af Amer >60 >60 mL/min    Comment: (NOTE) The eGFR has been calculated using the CKD EPI equation. This calculation has not been validated in all clinical situations. eGFR's persistently <60 mL/min signify possible Chronic Kidney Disease.    Anion gap 6 5 - 15  Protime-INR     Status: None   Collection Time: 03/19/17 10:47 AM  Result Value Ref Range   Prothrombin Time 13.4 11.4 - 15.2 seconds   INR 1.02     Imaging: No results  found.  Assessment/Plan 1. Elevated LFTs Proceed today with a random liver biopsy.  His labs and vitals have been reviewed. Risks and benefits discussed with the patient including, but not limited to bleeding, infection, damage to adjacent structures or low yield requiring additional tests. All of the patient's questions were answered, patient is agreeable to proceed. Consent signed and in chart.   Thank you for this interesting consult.  I greatly enjoyed meeting YAW ESCOTO and look forward to participating in their care.  A copy of this report was sent to the requesting provider on this date.  Electronically Signed: Henreitta Cea 03/19/2017, 12:45 PM   I spent a total of  30 Minutes   in face to face in clinical consultation, greater than 50% of which was counseling/coordinating care for elevated LFTs

## 2017-03-26 DIAGNOSIS — L700 Acne vulgaris: Secondary | ICD-10-CM | POA: Diagnosis not present

## 2017-04-07 DIAGNOSIS — K7581 Nonalcoholic steatohepatitis (NASH): Secondary | ICD-10-CM | POA: Diagnosis not present

## 2017-04-07 DIAGNOSIS — R7989 Other specified abnormal findings of blood chemistry: Secondary | ICD-10-CM | POA: Diagnosis not present

## 2017-04-07 DIAGNOSIS — R748 Abnormal levels of other serum enzymes: Secondary | ICD-10-CM | POA: Diagnosis not present

## 2017-04-21 DIAGNOSIS — L723 Sebaceous cyst: Secondary | ICD-10-CM | POA: Diagnosis not present

## 2017-06-14 DIAGNOSIS — L821 Other seborrheic keratosis: Secondary | ICD-10-CM | POA: Diagnosis not present

## 2017-06-14 DIAGNOSIS — D225 Melanocytic nevi of trunk: Secondary | ICD-10-CM | POA: Diagnosis not present

## 2017-06-14 DIAGNOSIS — L820 Inflamed seborrheic keratosis: Secondary | ICD-10-CM | POA: Diagnosis not present

## 2017-06-14 DIAGNOSIS — D2362 Other benign neoplasm of skin of left upper limb, including shoulder: Secondary | ICD-10-CM | POA: Diagnosis not present

## 2017-06-14 DIAGNOSIS — D1801 Hemangioma of skin and subcutaneous tissue: Secondary | ICD-10-CM | POA: Diagnosis not present

## 2017-07-06 DIAGNOSIS — R7989 Other specified abnormal findings of blood chemistry: Secondary | ICD-10-CM | POA: Diagnosis not present

## 2017-07-06 DIAGNOSIS — R946 Abnormal results of thyroid function studies: Secondary | ICD-10-CM | POA: Diagnosis not present

## 2017-09-02 DIAGNOSIS — M542 Cervicalgia: Secondary | ICD-10-CM | POA: Diagnosis not present

## 2017-09-17 DIAGNOSIS — R070 Pain in throat: Secondary | ICD-10-CM | POA: Diagnosis not present

## 2017-09-28 DIAGNOSIS — K760 Fatty (change of) liver, not elsewhere classified: Secondary | ICD-10-CM | POA: Diagnosis not present

## 2017-09-28 DIAGNOSIS — K7581 Nonalcoholic steatohepatitis (NASH): Secondary | ICD-10-CM | POA: Diagnosis not present

## 2017-09-28 DIAGNOSIS — R05 Cough: Secondary | ICD-10-CM | POA: Diagnosis not present

## 2017-09-28 DIAGNOSIS — Z0000 Encounter for general adult medical examination without abnormal findings: Secondary | ICD-10-CM | POA: Diagnosis not present

## 2017-09-28 DIAGNOSIS — Z125 Encounter for screening for malignant neoplasm of prostate: Secondary | ICD-10-CM | POA: Diagnosis not present

## 2017-11-03 DIAGNOSIS — L729 Follicular cyst of the skin and subcutaneous tissue, unspecified: Secondary | ICD-10-CM | POA: Diagnosis not present

## 2017-12-28 DIAGNOSIS — R030 Elevated blood-pressure reading, without diagnosis of hypertension: Secondary | ICD-10-CM | POA: Diagnosis not present

## 2017-12-28 DIAGNOSIS — Z6831 Body mass index (BMI) 31.0-31.9, adult: Secondary | ICD-10-CM | POA: Diagnosis not present

## 2017-12-28 DIAGNOSIS — M5137 Other intervertebral disc degeneration, lumbosacral region: Secondary | ICD-10-CM | POA: Diagnosis not present

## 2018-01-05 ENCOUNTER — Encounter: Payer: Self-pay | Admitting: Pulmonary Disease

## 2018-01-05 ENCOUNTER — Ambulatory Visit (INDEPENDENT_AMBULATORY_CARE_PROVIDER_SITE_OTHER): Payer: BLUE CROSS/BLUE SHIELD | Admitting: Pulmonary Disease

## 2018-01-05 VITALS — BP 134/76 | HR 86 | Ht 68.5 in | Wt 205.4 lb

## 2018-01-05 DIAGNOSIS — R053 Chronic cough: Secondary | ICD-10-CM

## 2018-01-05 DIAGNOSIS — R05 Cough: Secondary | ICD-10-CM | POA: Diagnosis not present

## 2018-01-05 DIAGNOSIS — R0683 Snoring: Secondary | ICD-10-CM | POA: Diagnosis not present

## 2018-01-05 DIAGNOSIS — R0681 Apnea, not elsewhere classified: Secondary | ICD-10-CM | POA: Diagnosis not present

## 2018-01-05 NOTE — Progress Notes (Signed)
Synopsis: Referred in 12/2017 for chronic cough  Subjective:   PATIENT ID: Wayne Forbes GENDER: male DOB: [birth year], MRN: [id number]   HPI  Chief Complaint  Patient presents with  . CONSULT    can't resolve cough with PCP.  tried various things that help short term.  non productive cough with occ wheeze for about 3 years..   [name] is a 57 year old male never smoker who presents as a new patient for management of chronic cough.  PCP note 09/28/2017) reviewed and summarized): Seen for routine physical.  Reports nonproductive chronic cough. Treated with Flonase and trial of Breo.  He reports three year history of non-productive cough that has gradually worsened with increased frequency. Cough does occur at night but is not limited to that timeframe.  He cannot identify any triggers that worsen his symptoms.  Nothing improves his symptoms. He has been using flonase with no relief. Denies associated shortness of breath, sputum production or wheezing. Denies reflux, retrosternal burning. Denies allergies.  He has an active lifestyle and enjoys hunting and working out twice a week including cardio and weight training.  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 States that environment is well ventilated.  He is required to perform pulmonary function tests every 2 years at work which the results have been normal per patient. He is a non-smoker but reports childhood exposure to second-hand smoking.    Patient reports that wife has witnessed patient snoring, gasping for breath at night and having periods of apnea.  He reports fatigue and daytime drowsiness. Denies daytime headaches. Has gained a few pounds over a year but gradually has increased 20lbs in the last two decades.  Epworth Sleepiness Scale  Situation Chance of Dozing (0-3) Sitting and reading  _________0_______________________________ Watching TV ______________1__________________________ Sitting, inactive in a public place (e.g. a theatre or a meeting) ___0______ As a passenger in a car for an hour without a break ______0___________ Lying down to rest in the afternoon when circumstances permit ____3____ Sitting and talking to someone ______________0____________________ Sitting quietly after a lunch without alcohol _____0___________________ In a car, while stopped for a few minutes in the traffic _______0_________   I have personally reviewed patient's past medical/family/social history, allergies, current medications.  Past Medical History:  Diagnosis Date  . Fatty liver 2019  . Kidney stone   . Kidney stones      Family History  Problem Relation Age of Onset  . Congestive Heart Failure Mother   . Heart disease Father   . Kidney failure Father      Social History   Socioeconomic History  . Marital status: Married    Spouse name: Not on file  . Number of children: Not on file  . Years of education: Not on file  . Highest education level: Not on file  Occupational History  . Not on file  Social Needs  . Financial resource strain: Not on file  . Food insecurity:    Worry: Not on file    Inability: Not on file  . Transportation needs:    Medical: Not on file    Non-medical: Not on file  Tobacco Use  . Smoking status: Never Smoker  . Smokeless tobacco: Never Used  Substance and Sexual Activity  . Alcohol use: Yes    Comment: weekend -socially beer  . Drug use: No  . Sexual activity: Not on file  Lifestyle  .  Physical activity:    Days per week: Not on file    Minutes per session: Not on file  . Stress: Not on file  Relationships  . Social connections:    Talks on phone: Not on file    Gets together: Not on file    Attends religious service: Not on file    Active member of club or organization: Not on file    Attends meetings of clubs or  organizations: Not on file    Relationship status: Not on file  . Intimate partner violence:    Fear of current or ex partner: Not on file    Emotionally abused: Not on file    Physically abused: Not on file    Forced sexual activity: Not on file  Other Topics Concern  . Not on file  Social History Narrative  . Not on file     No Known Allergies   Outpatient Medications Prior to Visit  Medication Sig Dispense Refill  . HYDROcodone-acetaminophen (NORCO/VICODIN) 5-325 MG per tablet 1 TO 2 TABS EVERY 4 HOURS PAIN AS NEEDED 15 tablet 0  . loratadine (CLARITIN) 10 MG tablet Take 10 mg by mouth daily.    . Multiple Vitamin (MULTIVITAMIN WITH MINERALS) TABS tablet Take 1 tablet by mouth daily.    . Omega-3 Fatty Acids (FISH OIL) 1000 MG CAPS Take by mouth.    . ondansetron (ZOFRAN ODT) 4 MG disintegrating tablet Take 1 tablet (4 mg total) by mouth every 8 (eight) hours as needed for nausea. 6 tablet 0   No facility-administered medications prior to visit.     Review of Systems  Constitutional: Negative for chills, diaphoresis, fever, malaise/fatigue and weight loss.  HENT: Negative for congestion and sore throat.   Respiratory: Positive for cough. Negative for hemoptysis, sputum production, shortness of breath and wheezing.   Cardiovascular: Positive for PND. Negative for chest pain, orthopnea and leg swelling.  Gastrointestinal: Negative for abdominal pain, heartburn and nausea.  Genitourinary: Negative for frequency.  Musculoskeletal: Positive for back pain and myalgias.  Skin: Negative for rash.  Neurological: Negative for dizziness, weakness and headaches.  Endo/Heme/Allergies: Does not bruise/bleed easily.  Psychiatric/Behavioral: The patient is not nervous/anxious.   All other systems reviewed and are negative.     Objective:  Physical Exam  Constitutional: He is oriented to person, place, and time. He appears well-developed and well-nourished. No distress.  HENT:  Head:  Normocephalic and atraumatic.  Nose: Nose normal.  Mouth/Throat: Oropharynx is clear and moist. No oropharyngeal exudate.  Eyes: Conjunctivae and EOM are normal. No scleral icterus.  Neck: Normal range of motion. Neck supple. No JVD present. No tracheal deviation present.  Cardiovascular: Normal rate, regular rhythm, normal heart sounds and intact distal pulses. Exam reveals no gallop and no friction rub.  No murmur heard. Pulmonary/Chest: Effort normal. No respiratory distress. He has no wheezes. He has no rales.  Abdominal: Soft. Bowel sounds are normal. He exhibits no distension. There is no tenderness.  Musculoskeletal: Normal range of motion. He exhibits no edema.  Lymphadenopathy:    He has no cervical adenopathy.  Neurological: He is alert and oriented to person, place, and time. No cranial nerve deficit.  Skin: Skin is warm and dry. No rash noted. He is not diaphoretic. No erythema.  Psychiatric: He has a normal mood and affect. His behavior is normal. Thought content normal.  Vitals reviewed.    Vitals:   01/05/18 1120  BP: 134/76  Pulse: 86  SpO2:  98%  Weight: 205 lb 6.4 oz (93.2 kg)  Height: 5' 8.5" (1.74 m)    CBC    Component Value Date/Time   WBC 6.1 03/19/2017 1047   RBC 4.66 03/19/2017 1047   HGB 15.6 03/19/2017 1047   HCT 43.2 03/19/2017 1047   PLT 189 03/19/2017 1047   MCV 92.7 03/19/2017 1047   MCH 33.5 03/19/2017 1047   MCHC 36.1 (H) 03/19/2017 1047   RDW 11.7 03/19/2017 1047   LYMPHSABS 2.0 03/19/2017 1047   MONOABS 0.6 03/19/2017 1047   EOSABS 0.1 03/19/2017 1047   BASOSABS 0.0 03/19/2017 1047     Chest imaging: CXR 7/28 4/16-unremarkable with no evidence of acute infiltrates, effusions, edema.  Left lower lobe calcification.  PFT: None on file  I have personally reviewed the above labs, images and tests noted above.    Assessment & Plan:   Snoring - Plan: Home sleep test  Cough - Plan: Pulmonary function test  Discussion: 57 year old  male with no pertinent medical history who presents with gradually worsening nonproductive chronic cough.  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  Common etiologies considered include upper airway cough syndrome, reflux and asthma.  Will evaluate with full PFTs.  Recommend continuing treatment for allergies and reflux as needed.  Patient also reports symptoms of snoring and witnessed apnea so will evaluate with home sleep study.    Chronic Cough --PFTs ordered  Witnessed apnea/Snoring --Home sleep study  Follow-up in 2 weeks   Thank you for choosing [facility] for your health needs!   Bricia Taher Mechele Collin, MD [facility] 01/05/2018 11:58 AM  Personal pager: [phone] If unanswered, please page [facility] On-call: #[phone]    Current Outpatient Medications:  .  HYDROcodone-acetaminophen (NORCO/VICODIN) 5-325 MG per tablet, 1 TO 2 TABS EVERY 4 HOURS PAIN AS NEEDED, Disp: 15 tablet, Rfl: 0 .  loratadine (CLARITIN) 10 MG tablet, Take 10 mg by mouth daily., Disp: , Rfl:  .  Multiple Vitamin (MULTIVITAMIN WITH MINERALS) TABS tablet, Take 1 tablet by mouth daily., Disp: , Rfl:  .  Omega-3 Fatty Acids (FISH OIL) 1000 MG CAPS, Take by mouth., Disp: , Rfl:

## 2018-01-05 NOTE — Patient Instructions (Signed)
   Chronic Cough Multiple causes for cough are possible including uncontrolled asthma, acid reflux and post-nasal drainage. We recommend the following to address these issues.  Post-Nasal Drainage Treatment       --Cetirizine 10 mg daily (Zyrtec)       --Flonase 2 sprays per nare daily        Reflux Treatment       --Avoid late meals, caffeine, alcohol, spicy food and overeating.       --Over the counter medications such as zantac can be used to treat reflux as needed        Asthma Treatment       --Pulmonary function test has been ordered   Follow-up with me in 2 weeks  Cough, Adult Coughing is a reflex that clears your throat and your airways. Coughing helps to heal and protect your lungs. It is normal to cough occasionally, but a cough that happens with other symptoms or lasts a long time may be a sign of a condition that needs treatment. A cough may last only 2-3 weeks (acute), or it may last longer than 8 weeks (chronic). What are the causes? Coughing is commonly caused by:  Breathing in substances that irritate your lungs.  A viral or bacterial respiratory infection.  Allergies.  Asthma.  Postnasal drip.  Smoking.  Acid backing up from the stomach into the esophagus (gastroesophageal reflux).  Certain medicines.  Chronic lung problems, including COPD (or rarely, lung cancer).  Other medical conditions such as heart failure.  Follow these instructions at home: Pay attention to any changes in your symptoms. Take these actions to help with your discomfort:  Take medicines only as told by your health care provider. ? If you were prescribed an antibiotic medicine, take it as told by your health care provider. Do not stop taking the antibiotic even if you start to feel better. ? Talk with your health care provider before you take a cough suppressant medicine.  Drink enough fluid to keep your urine clear or pale yellow.  If the air is dry, use a cold steam vaporizer  or humidifier in your bedroom or your home to help loosen secretions.  Avoid anything that causes you to cough at work or at home.  If your cough is worse at night, try sleeping in a semi-upright position.  Avoid cigarette smoke. If you smoke, quit smoking. If you need help quitting, ask your health care provider.  Avoid caffeine.  Avoid alcohol.  Rest as needed.  Contact a health care provider if:  You have new symptoms.  You cough up pus.  Your cough does not get better after 2-3 weeks, or your cough gets worse.  You cannot control your cough with suppressant medicines and you are losing sleep.  You develop pain that is getting worse or pain that is not controlled with pain medicines.  You have a fever.  You have unexplained weight loss.  You have night sweats. Get help right away if:  You cough up blood.  You have difficulty breathing.  Your heartbeat is very fast. This information is not intended to replace advice given to you by your health care provider. Make sure you discuss any questions you have with your health care provider. Document Released: 08/22/2010 Document Revised: 08/01/2015 Document Reviewed: 05/02/2014 Elsevier Interactive Patient Education  [employer].

## 2018-01-17 ENCOUNTER — Ambulatory Visit (INDEPENDENT_AMBULATORY_CARE_PROVIDER_SITE_OTHER): Payer: BLUE CROSS/BLUE SHIELD | Admitting: Pulmonary Disease

## 2018-01-17 DIAGNOSIS — G4733 Obstructive sleep apnea (adult) (pediatric): Secondary | ICD-10-CM | POA: Diagnosis not present

## 2018-01-17 DIAGNOSIS — R05 Cough: Secondary | ICD-10-CM

## 2018-01-17 DIAGNOSIS — R053 Chronic cough: Secondary | ICD-10-CM

## 2018-01-17 LAB — PULMONARY FUNCTION TEST
DL/VA % pred: 99 %
DL/VA: 4.42 ml/min/mmHg/L
DLCO unc % pred: 85 %
DLCO unc: 24.25 ml/min/mmHg
FEF 25-75 Post: 2.49 L/sec
FEF 25-75 Pre: 2.67 L/sec
FEF2575-%Change-Post: -7 %
FEF2575-%Pred-Post: 85 %
FEF2575-%Pred-Pre: 92 %
FEV1-%Change-Post: -1 %
FEV1-%Pred-Post: 85 %
FEV1-%Pred-Pre: 86 %
FEV1-Post: 2.86 L
FEV1-Pre: 2.91 L
FEV1FVC-%Change-Post: -3 %
FEV1FVC-%Pred-Pre: 103 %
FEV6-%Change-Post: 2 %
FEV6-%Pred-Post: 88 %
FEV6-%Pred-Pre: 87 %
FEV6-Post: 3.74 L
FEV6-Pre: 3.66 L
FEV6FVC-%Pred-Post: 104 %
FEV6FVC-%Pred-Pre: 104 %
FVC-%Change-Post: 2 %
FVC-%Pred-Post: 84 %
FVC-%Pred-Pre: 83 %
FVC-Post: 3.74 L
FVC-Pre: 3.66 L
Post FEV1/FVC ratio: 77 %
Post FEV6/FVC ratio: 100 %
Pre FEV1/FVC ratio: 79 %
Pre FEV6/FVC Ratio: 100 %

## 2018-01-17 NOTE — Progress Notes (Signed)
PFT completed today.  

## 2018-01-18 ENCOUNTER — Other Ambulatory Visit: Payer: Self-pay | Admitting: *Deleted

## 2018-01-18 DIAGNOSIS — R0683 Snoring: Secondary | ICD-10-CM

## 2018-01-19 DIAGNOSIS — G4733 Obstructive sleep apnea (adult) (pediatric): Secondary | ICD-10-CM | POA: Diagnosis not present

## 2018-01-21 ENCOUNTER — Ambulatory Visit: Payer: Self-pay | Admitting: Pulmonary Disease

## 2018-01-25 DIAGNOSIS — Z6831 Body mass index (BMI) 31.0-31.9, adult: Secondary | ICD-10-CM | POA: Diagnosis not present

## 2018-01-25 DIAGNOSIS — M5136 Other intervertebral disc degeneration, lumbar region: Secondary | ICD-10-CM | POA: Diagnosis not present

## 2018-01-25 DIAGNOSIS — R030 Elevated blood-pressure reading, without diagnosis of hypertension: Secondary | ICD-10-CM | POA: Diagnosis not present

## 2018-01-26 ENCOUNTER — Other Ambulatory Visit: Payer: Self-pay | Admitting: Student

## 2018-01-26 DIAGNOSIS — M5136 Other intervertebral disc degeneration, lumbar region: Secondary | ICD-10-CM

## 2018-01-28 ENCOUNTER — Ambulatory Visit (INDEPENDENT_AMBULATORY_CARE_PROVIDER_SITE_OTHER): Payer: BLUE CROSS/BLUE SHIELD | Admitting: Pulmonary Disease

## 2018-01-28 ENCOUNTER — Encounter: Payer: Self-pay | Admitting: Pulmonary Disease

## 2018-01-28 VITALS — BP 110/74 | HR 95 | Ht 68.5 in | Wt 205.8 lb

## 2018-01-28 DIAGNOSIS — G4733 Obstructive sleep apnea (adult) (pediatric): Secondary | ICD-10-CM

## 2018-01-28 DIAGNOSIS — R059 Cough, unspecified: Secondary | ICD-10-CM

## 2018-01-28 DIAGNOSIS — R05 Cough: Secondary | ICD-10-CM

## 2018-01-28 MED ORDER — ALBUTEROL SULFATE 108 (90 BASE) MCG/ACT IN AEPB: 2.0000 | INHALATION_SPRAY | Freq: Four times a day (QID) | RESPIRATORY_TRACT | 3 refills | Status: DC | PRN

## 2018-01-28 NOTE — Patient Instructions (Signed)
Moderate Obstructive Sleep Apnea Home sleep study - AHI 21.4 with nadir oxygen 78% --Will order CPAP titration  Restrictive lung defect --CT Chest without contrast ordered to evaluate lung parenchyma --Take Albuterol inhaler 2 puffs every 4 hours as needed for shortness of breath or wheezing  Follow-up with me in 3 months

## 2018-01-30 NOTE — Progress Notes (Signed)
Synopsis: Referred in 12/2017 for chronic cough  Subjective:   PATIENT ID: Wayne Forbes GENDER: male DOB: 1960/11/22, MRN: [id number]   HPI  Chief Complaint  Patient presents with  . Follow-up    still coughing.  Does not see much difference since last visit.  There are times when it is a little better for a few day.   Mr. Rafe Mackowski is a 57 year old male never smoker who presents for follow-up of chronic cough.  On his last visit, he was evaluated for a 3-year history of nonproductive cough that has worsened and increased in frequency in the last year.  Cough occurs throughout the day and night.  Patient cannot identify any triggers.  He has tried [employer] and a trial of Breo and over-the-counter medications with no changes in his cough.  However after using albuterol on the day of his PFTs he noticed that he did not cough for 2 to 3 days.  Denies associated shortness of breath, sputum production or wheezing.  Denies allergies, reflux.  He is active at baseline and enjoys hunting and working out twice a week which includes cardio and weight training.  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 States that environment is well ventilated.  He is required to perform pulmonary function tests every 2 years at work which the results have been normal per patient. He is a non-smoker but reports childhood exposure to second-hand smoking.    I have personally reviewed patient's past medical/family/social history, allergies/current medications  Past Medical History:  Diagnosis Date  . Fatty liver 2019  . Kidney stone   . Kidney stones      Family History  Problem Relation Age of Onset  . Congestive Heart Failure Mother   . Heart disease Father   . Kidney failure Father      Social History   Socioeconomic History  . Marital status: Married    Spouse name: Not on  file  . Number of children: Not on file  . Years of education: Not on file  . Highest education level: Not on file  Occupational History  . Not on file  Social Needs  . Financial resource strain: Not on file  . Food insecurity:    Worry: Not on file    Inability: Not on file  . Transportation needs:    Medical: Not on file    Non-medical: Not on file  Tobacco Use  . Smoking status: Never Smoker  . Smokeless tobacco: Never Used  Substance and Sexual Activity  . Alcohol use: Yes    Comment: weekend -socially beer  . Drug use: No  . Sexual activity: Not on file  Lifestyle  . Physical activity:    Days per week: Not on file    Minutes per session: Not on file  . Stress: Not on file  Relationships  . Social connections:    Talks on phone: Not on file    Gets together: Not on file    Attends religious service: Not on file    Active member of club or organization: Not on file    Attends meetings of clubs or organizations: Not on file    Relationship status: Not on file  . Intimate partner violence:    Fear of current or ex partner: Not on file    Emotionally abused: Not on file    Physically abused: Not on  file    Forced sexual activity: Not on file  Other Topics Concern  . Not on file  Social History Narrative  . Not on file     No Known Allergies   Outpatient Medications Prior to Visit  Medication Sig Dispense Refill  . HYDROcodone-acetaminophen (NORCO/VICODIN) 5-325 MG per tablet 1 TO 2 TABS EVERY 4 HOURS PAIN AS NEEDED 15 tablet 0  . loratadine (CLARITIN) 10 MG tablet Take 10 mg by mouth daily.    . Multiple Vitamin (MULTIVITAMIN WITH MINERALS) TABS tablet Take 1 tablet by mouth daily.    . Omega-3 Fatty Acids (FISH OIL) 1000 MG CAPS Take by mouth.     No facility-administered medications prior to visit.     Review of Systems  Constitutional: Negative for chills, diaphoresis, fever, malaise/fatigue and weight loss.  HENT: Negative for congestion and sore  throat.   Respiratory: Positive for cough. Negative for hemoptysis, sputum production, shortness of breath and wheezing.   Cardiovascular: Negative for chest pain, orthopnea, leg swelling and PND.  Gastrointestinal: Negative for abdominal pain, heartburn and nausea.  Genitourinary: Negative for frequency.  Musculoskeletal: Positive for back pain and myalgias.  Skin: Negative for rash.  Neurological: Negative for dizziness, weakness and headaches.  Endo/Heme/Allergies: Does not bruise/bleed easily.      Objective:  Physical Exam  Constitutional: He is oriented to person, place, and time. He appears well-developed and well-nourished. No distress.  HENT:  Head: Normocephalic and atraumatic.  Nose: Nose normal.  Mouth/Throat: Oropharynx is clear and moist. No oropharyngeal exudate.  Eyes: Conjunctivae and EOM are normal. No scleral icterus.  Neck: Normal range of motion. Neck supple. No JVD present. No tracheal deviation present.  Cardiovascular: Normal rate, regular rhythm, normal heart sounds and intact distal pulses. Exam reveals no gallop and no friction rub.  No murmur heard. Pulmonary/Chest: Effort normal. No respiratory distress. He has no wheezes. He has no rales.  Abdominal: Soft. Bowel sounds are normal. He exhibits no distension. There is no tenderness.  Musculoskeletal: Normal range of motion. He exhibits no edema.  Neurological: He is alert and oriented to person, place, and time. No cranial nerve deficit.  Skin: Skin is warm and dry. No rash noted. He is not diaphoretic. No erythema.  Psychiatric: He has a normal mood and affect. His behavior is normal. Thought content normal.  Vitals reviewed.    Vitals:   01/28/18 1528  BP: 110/74  Pulse: 95  SpO2: 97%  Weight: 205 lb 12.8 oz (93.4 kg)  Height: 5' 8.5" (1.74 m)    Chest imaging: CXR 7/28 4/16-unremarkable with no evidence of acute infiltrates, effusions, edema.  Left lower lobe calcification.  PFT:  01/17/18  - Mild restrictive defect. No obstruction or significant bronchodilator effect. Normal DLCO  Home sleep study from 01/17/18 - moderate obstructive sleep apnea with an AHI of 21.4 and SpO2 low of 78%.  I have personally reviewed the above labs, images and tests noted above.    Assessment & Plan:   Moderate obstructive sleep apnea - Plan: Cpap titration  Cough - Plan: CT Chest Wo Contrast  Discussion: 57 year old man who presents for pulmonary follow-up for cough.  PFTs reviewed and do not demonstrate obstructive defect however mild restrictive defect noted.  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  Other common etiologies still include upper airway cough syndrome, reflux and asthma.  Recommend continuing treatment for allergies and reflux as needed.  Patient also underwent sleep study which was positive for sleep apnea.  Recommend CPAP titration.  Moderate Obstructive Sleep Apnea Home sleep study - AHI 21.4 with nadir oxygen 78% --Will order CPAP titration  Restrictive lung defect --CT Chest without contrast ordered to evaluate lung parenchyma --Take Albuterol inhaler 2 puffs every 4 hours as needed for shortness of breath or wheezing   Thank you for choosing [facility] for your health needs!   Arno Cullers Mechele Collin, MD [facility] 01/30/2018 9:57 AM  Personal pager: [phone] If unanswered, please page CCM On-call: [phone]    Current Outpatient Medications:  .  HYDROcodone-acetaminophen (NORCO/VICODIN) 5-325 MG per tablet, 1 TO 2 TABS EVERY 4 HOURS PAIN AS NEEDED, Disp: 15 tablet, Rfl: 0 .  loratadine (CLARITIN) 10 MG tablet, Take 10 mg by mouth daily., Disp: , Rfl:  .  Multiple Vitamin (MULTIVITAMIN WITH MINERALS) TABS tablet, Take 1 tablet by mouth daily., Disp: , Rfl:  .  Omega-3 Fatty Acids (FISH OIL) 1000 MG CAPS, Take by mouth., Disp: , Rfl:  .   Albuterol Sulfate (PROAIR RESPICLICK) 108 (90 Base) MCG/ACT AEPB, Inhale 2 puffs into the lungs every 6 (six) hours as needed., Disp: 1 each, Rfl: 3

## 2018-02-10 ENCOUNTER — Ambulatory Visit
Admission: RE | Admit: 2018-02-10 | Discharge: 2018-02-10 | Disposition: A | Discharge status: Home / Self Care | Payer: BLUE CROSS/BLUE SHIELD | Source: Ambulatory Visit | Attending: Student | Admitting: Student

## 2018-02-10 DIAGNOSIS — M545 Low back pain: Secondary | ICD-10-CM | POA: Diagnosis not present

## 2018-02-10 DIAGNOSIS — M5136 Other intervertebral disc degeneration, lumbar region: Secondary | ICD-10-CM

## 2018-02-10 MED ORDER — IOPAMIDOL (ISOVUE-M 200) INJECTION 41%
1.0000 mL | Freq: Once | INTRAMUSCULAR | Status: AC
  Administered 2018-02-10: 1 mL via EPIDURAL

## 2018-02-10 MED ORDER — METHYLPREDNISOLONE ACETATE 40 MG/ML INJ SUSP (RADIOLOG
120.0000 mg | Freq: Once | INTRAMUSCULAR | Status: AC
  Administered 2018-02-10: 120 mg via EPIDURAL

## 2018-02-10 NOTE — Discharge Instructions (Signed)

## 2018-02-18 DIAGNOSIS — N200 Calculus of kidney: Secondary | ICD-10-CM | POA: Diagnosis not present

## 2018-02-22 ENCOUNTER — Ambulatory Visit (INDEPENDENT_AMBULATORY_CARE_PROVIDER_SITE_OTHER)
Admission: RE | Admit: 2018-02-22 | Discharge: 2018-02-22 | Disposition: A | Discharge status: Home / Self Care | Payer: BLUE CROSS/BLUE SHIELD | Source: Ambulatory Visit | Attending: Pulmonary Disease | Admitting: Pulmonary Disease

## 2018-02-22 DIAGNOSIS — R059 Cough, unspecified: Secondary | ICD-10-CM

## 2018-02-22 DIAGNOSIS — R05 Cough: Secondary | ICD-10-CM | POA: Diagnosis not present

## 2018-02-22 DIAGNOSIS — R0602 Shortness of breath: Secondary | ICD-10-CM | POA: Diagnosis not present

## 2018-02-26 ENCOUNTER — Telehealth: Payer: Self-pay | Admitting: Pulmonary Disease

## 2018-02-26 NOTE — Telephone Encounter (Signed)
Please contact patient that CT chest shows that his lymph nodes may have evidence of prior inflammation however I would not expect this to cause his cough.  We can discuss the CT findings at his next visit.

## 2018-02-26 NOTE — Progress Notes (Signed)
Please contact patient that CT chest shows that his lymph nodes may have granulomas however I would not expect this to cause his cough.  We can discuss the CT findings at his next visit.    J.Brave Dack

## 2018-02-28 NOTE — Telephone Encounter (Signed)
Called and spoke with patient, relayed information above from Dr. Everardo AllEllison, patient verbalized understanding. No questions. Patient will keep follow up appointment for 05/02/18. Nothing further needed, will close encounter.

## 2018-03-21 ENCOUNTER — Ambulatory Visit (HOSPITAL_BASED_OUTPATIENT_CLINIC_OR_DEPARTMENT_OTHER): Payer: BLUE CROSS/BLUE SHIELD | Attending: Pulmonary Disease | Admitting: Pulmonary Disease

## 2018-03-21 DIAGNOSIS — G4733 Obstructive sleep apnea (adult) (pediatric): Secondary | ICD-10-CM | POA: Insufficient documentation

## 2018-03-28 DIAGNOSIS — G4733 Obstructive sleep apnea (adult) (pediatric): Secondary | ICD-10-CM | POA: Diagnosis not present

## 2018-03-28 NOTE — Procedures (Signed)
    Patient Name: Wayne Forbes, Wayne Forbes Date: 03/21/2018 Gender: Male D.O.B: 07-21-1960 Age (years): 66 Referring Provider: Chi Mechele Collin Height (inches): 68 Interpreting Physician: Coralyn Helling MD, ABSM Weight (lbs): 204 RPSGT: Ulyess Mort BMI: 31 MRN: [id number] Neck Size: 16.00 <br> <br>  CLINICAL INFORMATION  The patient is referred for a CPAP titration to treat sleep apnea.  Date of HST:  01/17/18, AHI 21.4 and SpO2 low 78%. SLEEP STUDY TECHNIQUE  As per the AASM Manual for the Scoring of Sleep and Associated Events v2.3 (April 2016) with a hypopnea requiring 4% desaturations. The channels recorded and monitored were frontal, central and occipital EEG, electrooculogram (EOG), submentalis EMG (chin), nasal and oral airflow, thoracic and abdominal wall motion, anterior tibialis EMG, snore microphone, electrocardiogram, and pulse oximetry. Continuous positive airway pressure (CPAP) was initiated at the beginning of the study and titrated to treat sleep-disordered breathing. MEDICATIONS  Medications self-administered by patient taken the night of the study : N/A TECHNICIAN COMMENTS  Comments added by technician: PATIENT WAS ORDERED AS A CPAP TITRATION. Comments added by scorer: N/A RESPIRATORY PARAMETERS  Optimal PAP Pressure (cm): 9 AHI at Optimal Pressure (/hr): 6.0 Overall Minimal O2 (%): 87.0 Supine % at Optimal Pressure (%): 58 Minimal O2 at Optimal Pressure (%): 92.0   SLEEP ARCHITECTURE  The study was initiated at 10:47:00 PM and ended at 5:18:17 AM. Sleep onset time was 7.4 minutes and the sleep efficiency was 65.6%%. The total sleep time was 256.5 minutes. The patient spent 20.7%% of the night in stage N1 sleep, 66.9%% in stage N2 sleep, 0.0%% in stage N3 and 12.5% in REM.Stage REM latency was 63.0 minutes Wake after sleep onset was 127.3. Alpha intrusion was absent. Supine sleep was 46.39%. CARDIAC DATA  The 2 lead EKG demonstrated sinus rhythm.  The mean heart rate was 64.5 beats per minute. Other EKG findings include: None. LEG MOVEMENT DATA  The total Periodic Limb Movements of Sleep (PLMS) were 0. The PLMS index was 0.0. A PLMS index of <15 is considered normal in adults. IMPRESSIONS  - The optimal PAP pressure was 9 cm of water. - He did not require supplemental oxygen during this study. DIAGNOSIS  - Obstructive Sleep Apnea (327.23 [G47.33 ICD-10]) RECOMMENDATIONS  - Trial of CPAP therapy on 9 cm H2O with a Medium Wide size Philips Respironics Full Face Mask Dreamwear mask and heated humidification.  [Electronically signed] 03/28/2018 03:41 PM  Coralyn Helling MD, ABSM Diplomate, American Board of Sleep Medicine  NPI: [id number]

## 2018-04-02 ENCOUNTER — Encounter: Payer: Self-pay | Admitting: Pulmonary Disease

## 2018-04-04 ENCOUNTER — Telehealth: Payer: Self-pay

## 2018-04-04 DIAGNOSIS — G4733 Obstructive sleep apnea (adult) (pediatric): Secondary | ICD-10-CM

## 2018-04-04 NOTE — Telephone Encounter (Signed)
Contacted patient by phone to inform of new CPAP order placed as per Dr. George Hugh order. Patient should be receiving call from DME company to set up CPAP with next two weeks.  Also moved Feb appointment to March 13 at 11am in order to meet CPAP 31 days review.  Feb appointment cancelled.  Patient acknowledged understanding of  above info and given new appointment date and time. Nothing further needed at this time.

## 2018-04-04 NOTE — Telephone Encounter (Signed)
Telephone note of call to patient.

## 2018-04-04 NOTE — Telephone Encounter (Signed)
-----   Message from Luciano Cutter, MD sent at 04/02/2018  3:07 PM EST ----- Please order CPAP for Wayne Forbes  CPAP therapy on 9 cm H2O with a Medium Wide size Philips Respironics Full Face Mask Dreamwear mask and heated humidification.  JE

## 2018-04-15 DIAGNOSIS — G4733 Obstructive sleep apnea (adult) (pediatric): Secondary | ICD-10-CM | POA: Diagnosis not present

## 2018-04-22 DIAGNOSIS — H524 Presbyopia: Secondary | ICD-10-CM | POA: Diagnosis not present

## 2018-04-22 DIAGNOSIS — H1851 Endothelial corneal dystrophy: Secondary | ICD-10-CM | POA: Diagnosis not present

## 2018-04-28 ENCOUNTER — Telehealth: Payer: Self-pay | Admitting: Pulmonary Disease

## 2018-04-28 DIAGNOSIS — G4733 Obstructive sleep apnea (adult) (pediatric): Secondary | ICD-10-CM

## 2018-04-28 NOTE — Telephone Encounter (Signed)
We could try auto set 5-15 cm H20. Please follow up with Dr. Everardo All if still has issues.

## 2018-04-28 NOTE — Telephone Encounter (Signed)
Called and spoke with Patient. Archie Patten NP recommendations given.  Understanding stated.  DME order placed.  Nothing further at this time.

## 2018-04-28 NOTE — Telephone Encounter (Signed)
Patient requesting CPAP pressure change.  He stated that he feels like he is smothering at night, not getting any sleep.  He stated that he is barely sleeping 4 hours per night. Patient last OV 01/28/18, with Dr Everardo All. DME [facility].   Message routed to [place], [place], NP to advise on cpap pressure

## 2018-05-02 ENCOUNTER — Ambulatory Visit: Payer: Self-pay | Admitting: Pulmonary Disease

## 2018-05-05 ENCOUNTER — Telehealth: Payer: Self-pay

## 2018-05-05 NOTE — Telephone Encounter (Signed)
Contacted patient to make sure he has been on CPAP several weeks in order to meet 31 days for appt on 05/20/18 with JE.  Noted that patient had made contact regarding difficulty using CPAP.  Patient states he will meet a months worth of usage by 05/20/18 appointment but he is still having difficulty using it.  Averages about 4 hours per night.  States he feels clautrophobic.  Plans to keep 05/20/18 appointment.  Thinks he may not be able to use CPAP long term.  Creates a lot of 'anxiety' for him.  Unable to view CPAP report in airview or care orchestrator today.  Nothing further needed.

## 2018-05-14 DIAGNOSIS — G4733 Obstructive sleep apnea (adult) (pediatric): Secondary | ICD-10-CM | POA: Diagnosis not present

## 2018-05-14 IMAGING — US US ABDOMEN LIMITED
1 series · 14 of 25 positions shown · non-contrast
Comparison: None.

CLINICAL DATA: Elevated LFTs.

EXAM:
ULTRASOUND ABDOMEN LIMITED RIGHT UPPER QUADRANT

[Series 1: us abdomen limited · 0.16mm/px · 14 of 43 slices shown]
[im 1/43]
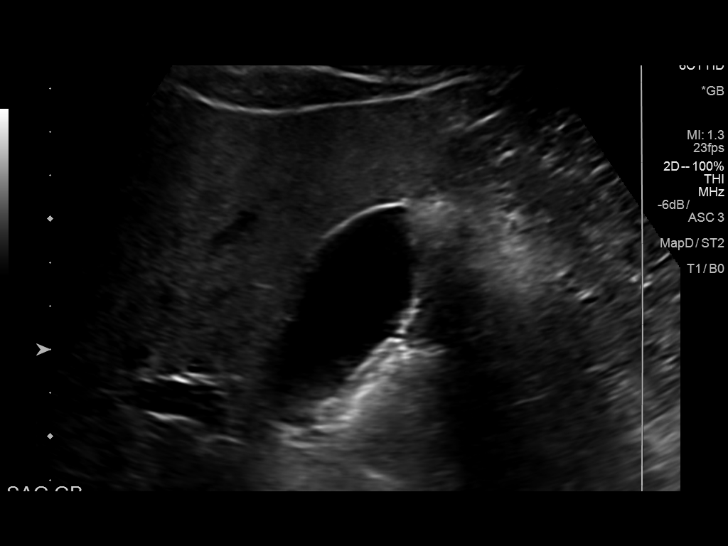
[im 4/43]
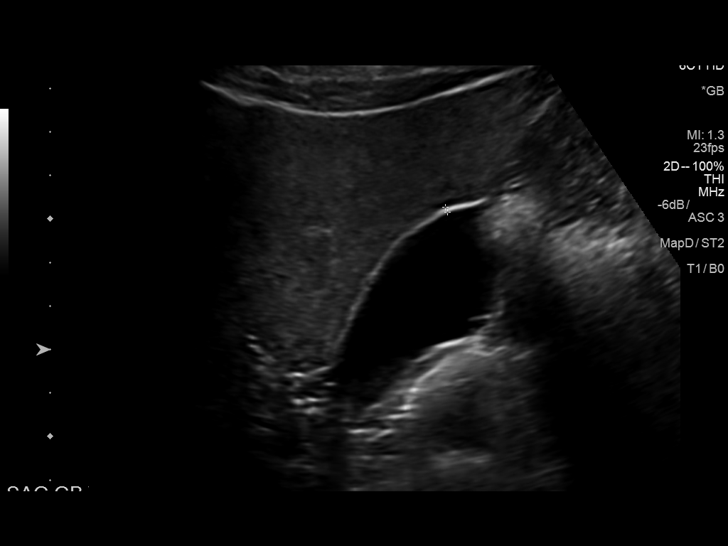
[im 8/43]
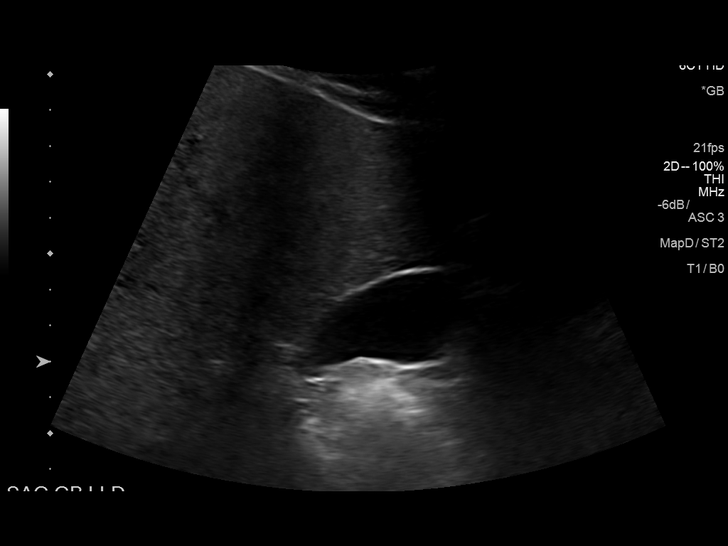
[im 11/43]
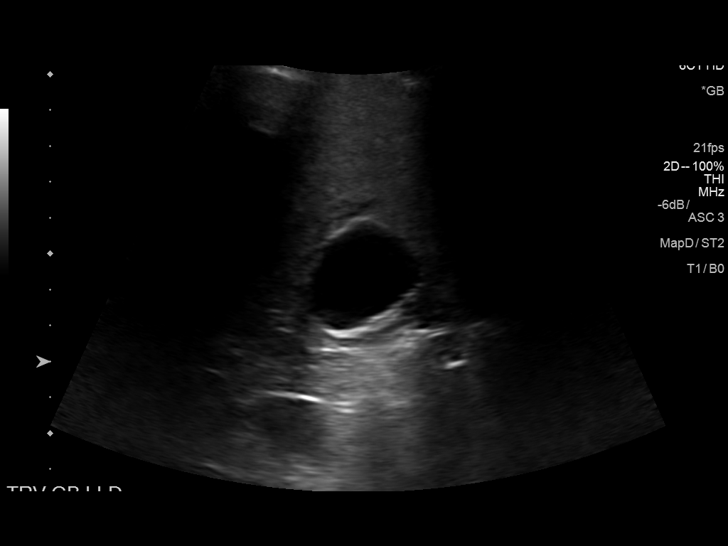
[im 15/43]
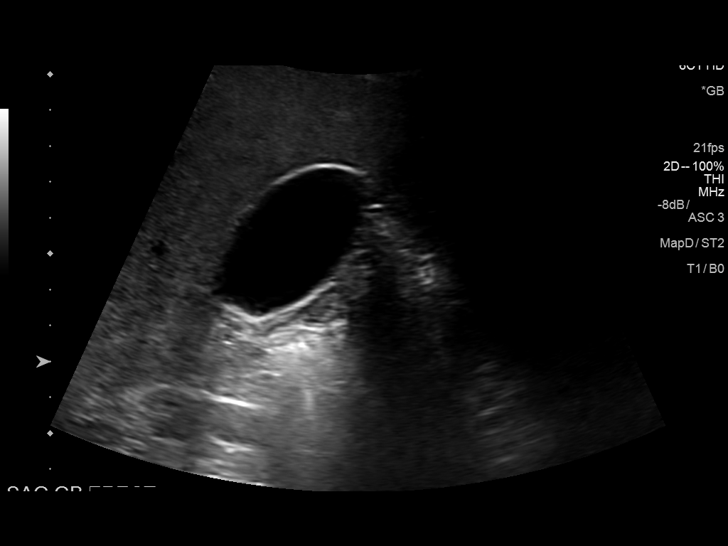
[im 16/43]
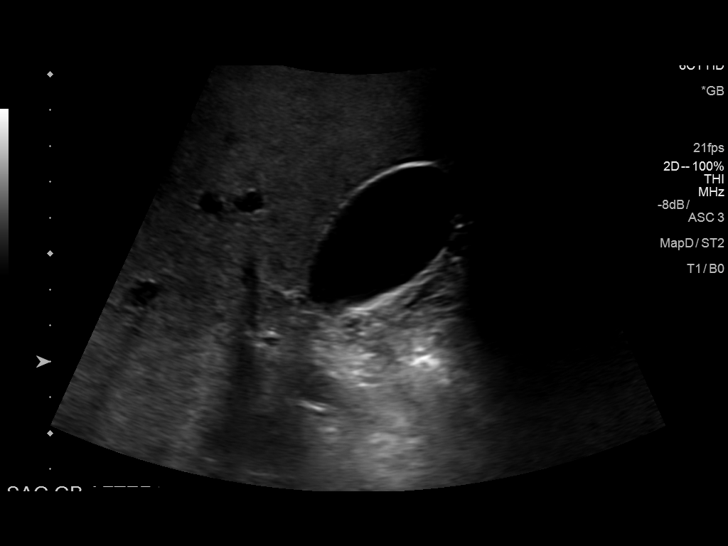
[im 20/43]
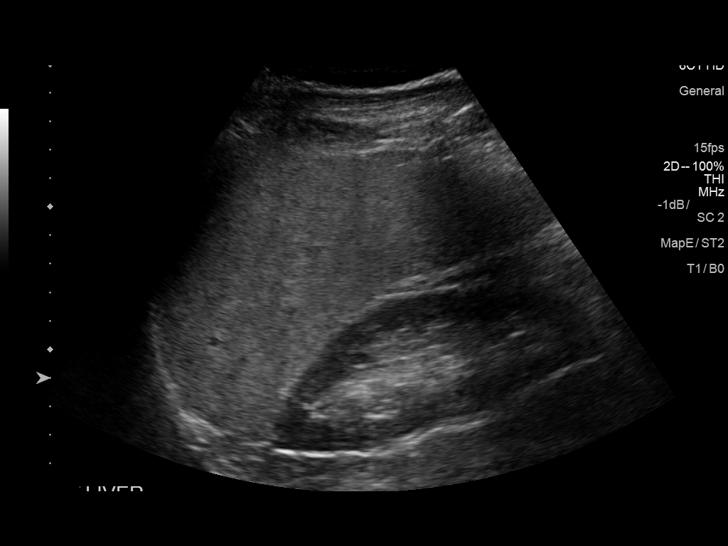
[im 23/43]
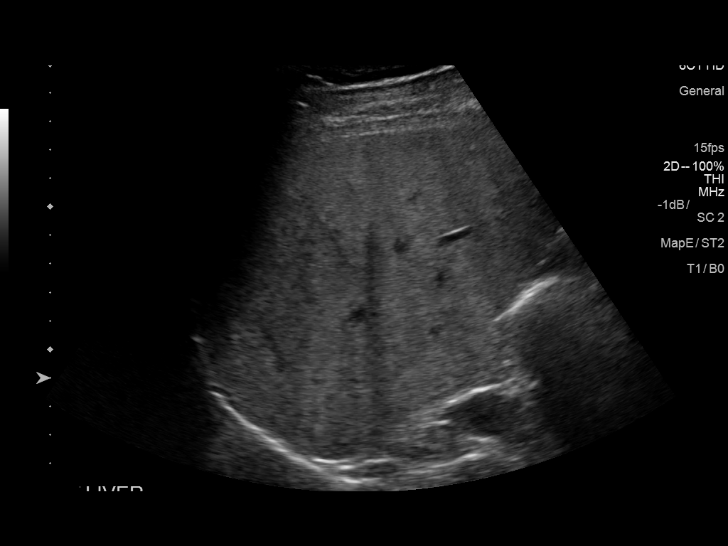
[im 27/43]
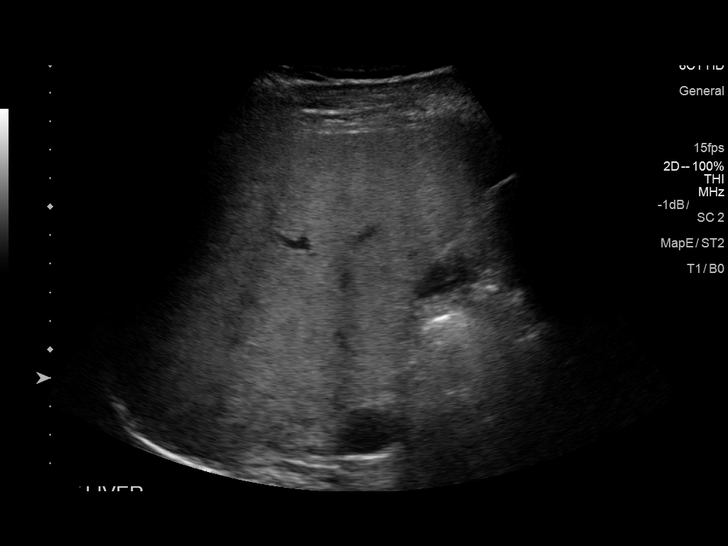
[im 29/43]
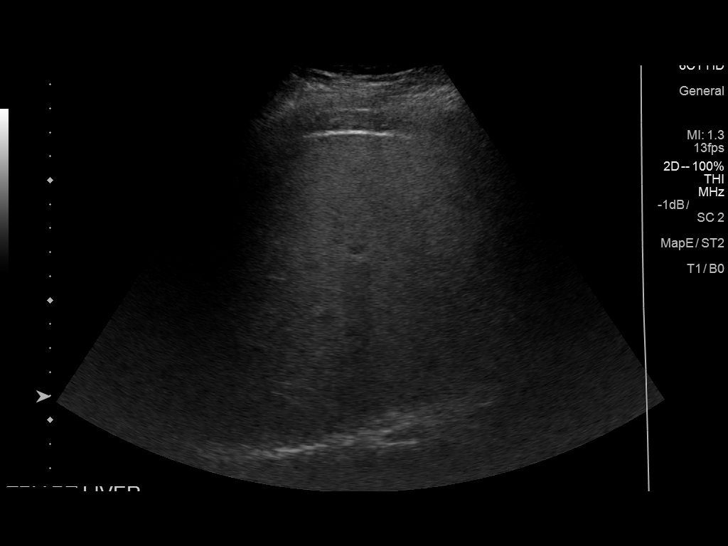
[im 32/43]
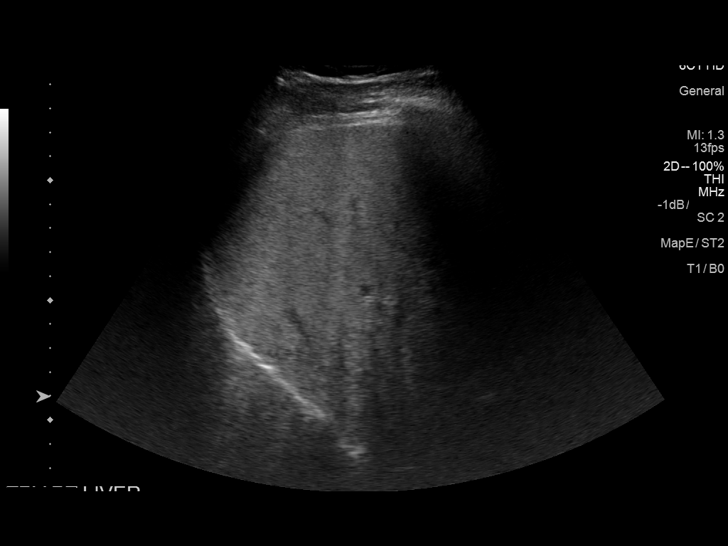
[im 36/43]
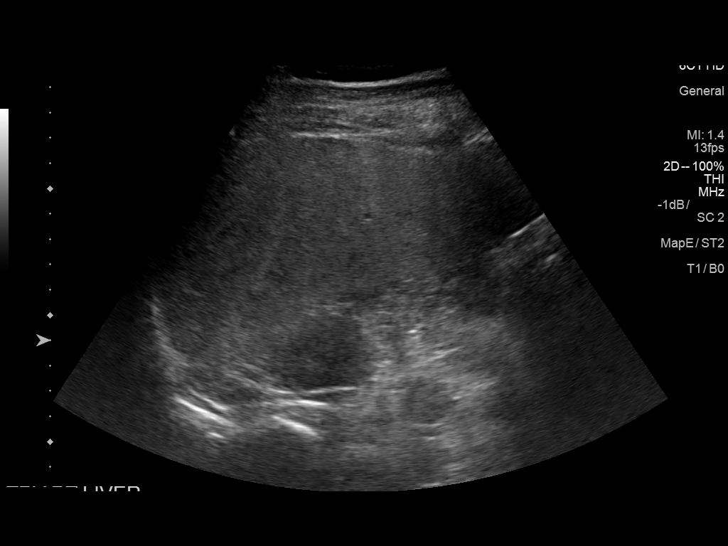
[im 39/43]
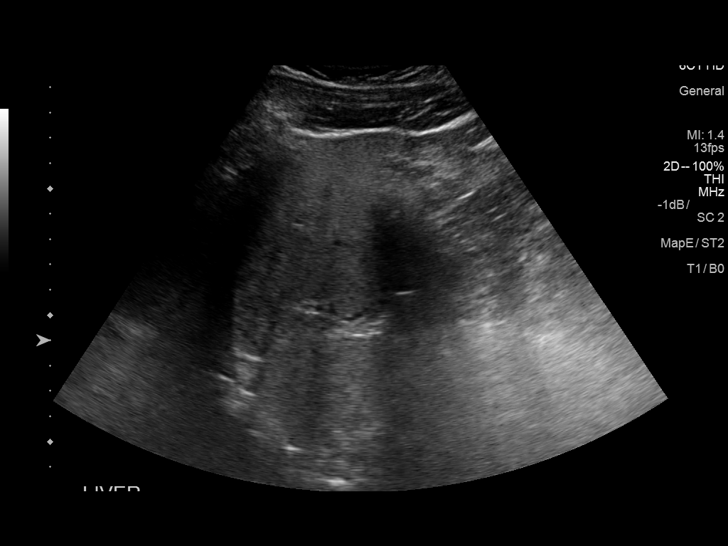
[im 43/43]
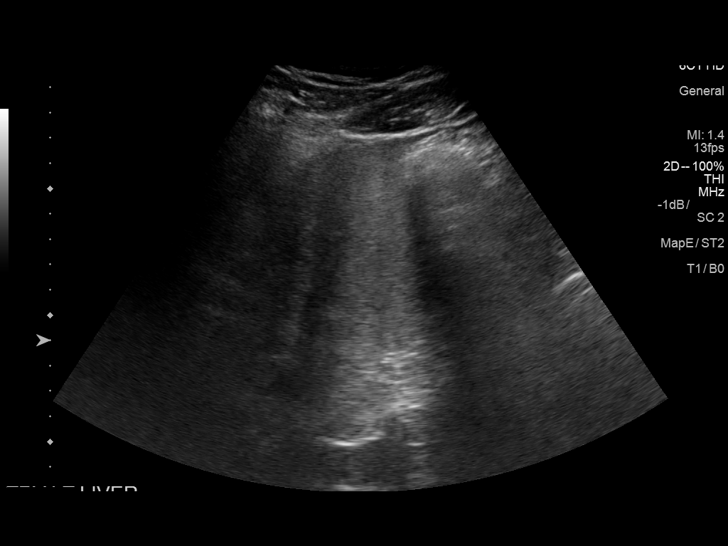

[14 of 25 positions shown; findings below may reference images not displayed]

FINDINGS: Gallbladder:

No gallstones or wall thickening visualized. No sonographic Murphy
sign noted by sonographer.

Common bile duct:

Diameter: 3.7 mm

Liver:

Heterogeneous hepatic echotexture consistent with fatty infiltration
and/or hepatocellular disease. No focal hepatic abnormality
identified. Portal vein is patent on color Doppler imaging with
normal direction of blood flow towards the liver.
IMPRESSION: 1. Heterogeneous hepatic echotexture consistent with fatty
infiltration and/or hepatocellular disease. No focal hepatic
abnormality identified.

2. No gallstones or biliary distention.

## 2018-05-16 DIAGNOSIS — R509 Fever, unspecified: Secondary | ICD-10-CM | POA: Diagnosis not present

## 2018-05-16 DIAGNOSIS — R05 Cough: Secondary | ICD-10-CM | POA: Diagnosis not present

## 2018-05-20 ENCOUNTER — Ambulatory Visit: Payer: Self-pay | Admitting: Pulmonary Disease

## 2018-05-25 DIAGNOSIS — N200 Calculus of kidney: Secondary | ICD-10-CM | POA: Diagnosis not present

## 2018-05-26 ENCOUNTER — Other Ambulatory Visit: Payer: Self-pay

## 2018-05-26 ENCOUNTER — Ambulatory Visit (HOSPITAL_COMMUNITY)
Admission: RE | Admit: 2018-05-26 | Discharge: 2018-05-26 | Disposition: A | Discharge status: Home / Self Care | Payer: BLUE CROSS/BLUE SHIELD | Attending: Urology | Admitting: Urology

## 2018-05-26 ENCOUNTER — Ambulatory Visit (HOSPITAL_COMMUNITY): Payer: BLUE CROSS/BLUE SHIELD

## 2018-05-26 ENCOUNTER — Other Ambulatory Visit: Payer: Self-pay | Admitting: Urology

## 2018-05-26 ENCOUNTER — Encounter (HOSPITAL_COMMUNITY)
Admission: RE | Disposition: A | Discharge status: Home / Self Care | Payer: Self-pay | Source: Home / Self Care | Attending: Urology

## 2018-05-26 ENCOUNTER — Encounter (HOSPITAL_COMMUNITY): Payer: Self-pay

## 2018-05-26 DIAGNOSIS — Z841 Family history of disorders of kidney and ureter: Secondary | ICD-10-CM | POA: Insufficient documentation

## 2018-05-26 DIAGNOSIS — Z79899 Other long term (current) drug therapy: Secondary | ICD-10-CM | POA: Diagnosis not present

## 2018-05-26 DIAGNOSIS — Z5309 Procedure and treatment not carried out because of other contraindication: Secondary | ICD-10-CM | POA: Diagnosis not present

## 2018-05-26 DIAGNOSIS — N200 Calculus of kidney: Secondary | ICD-10-CM | POA: Diagnosis not present

## 2018-05-26 HISTORY — DX: Sleep apnea, unspecified: G47.30

## 2018-05-26 HISTORY — PX: EXTRACORPOREAL SHOCK WAVE LITHOTRIPSY: SHX1557

## 2018-05-26 SURGERY — LITHOTRIPSY, ESWL
Anesthesia: LOCAL | Laterality: Right

## 2018-05-26 MED ORDER — SODIUM CHLORIDE 0.9 % IV SOLN
INTRAVENOUS | Status: DC
  Administered 2018-05-26: 11:00:00 via INTRAVENOUS

## 2018-05-26 MED ORDER — CIPROFLOXACIN HCL 500 MG PO TABS
500.0000 mg | ORAL_TABLET | ORAL | Status: AC
  Administered 2018-05-26: 500 mg via ORAL
  Filled 2018-05-26: qty 1

## 2018-05-26 MED ORDER — DIPHENHYDRAMINE HCL 25 MG PO CAPS
25.0000 mg | ORAL_CAPSULE | ORAL | Status: AC
  Administered 2018-05-26: 25 mg via ORAL
  Filled 2018-05-26: qty 1

## 2018-05-26 MED ORDER — DIAZEPAM 5 MG PO TABS
10.0000 mg | ORAL_TABLET | ORAL | Status: AC
  Administered 2018-05-26: 10 mg via ORAL
  Filled 2018-05-26: qty 2

## 2018-05-26 NOTE — H&P (Signed)
I have kidney stones.  HPI: Wayne Forbes is a 58 year-old male established patient who is here for renal calculi.  This patient complains of right renal colic and nausea for approximately 3 hours. He also complains of dark urine. He denies frank, gross hematuria, dysuria, or difficulty urinating. He denies vomiting or fevers.   He also reports a history of influenza last week, and he has remained asymptomatic since then. Per the patient, his PCP "cleared him" and he has remained afebrile. He denies coughing or SOB.   The problem is on both sides. He is currently having back pain and nausea. He denies having flank pain, groin pain, vomiting, fever, and chills.     ALLERGIES: No Allergies    MEDICATIONS: Albuterol Sulfate  Fish Oil  Hydrocodone-Acetaminophen PRN  Vitamin B Complex  Vitamin D     GU PSH: ESWL - 2008      PSH Notes: Treatment Of Humerus / Arm, Lithotripsy, Cystourethroscopy   NON-GU PSH: Elbow Surgery (Unspecified) - 2014    GU PMH: Renal calculus - 02/18/2018, Nephrolithiasis, - 2014 Ureteral calculus (Stable) - 08/12/2016, Calculus of ureter, - 2014, Ureteral Stone, - 2014 Dysuria, Dysuria - 2014 Encounter for Prostate Cancer screening, Prostate cancer screening - 2014      PMH Notes:  2008-02-06 15:13:09 - Note: Normal Routine History And Physical Adult  2006-08-25 09:15:55 - Note: Urinary Calculus   NON-GU PMH: Personal history of other diseases of the digestive system, History of esophageal reflux - 2014 Fatty (change of) liver, not elsewhere classified    FAMILY HISTORY: Acute Myocardial Infarction - Father Family Health Status Number - Runs In Family nephrolithiasis - Sister, Father, Brother   SOCIAL HISTORY: Marital Status: Married Preferred Language: English; Ethnicity: Not Hispanic Or Latino; Race: White Current Smoking Status: Patient has never smoked.   Tobacco Use Assessment Completed: Used Tobacco in last 30 days? Does drink.  Drinks 2  caffeinated drinks per day.     Notes: Never A Smoker, Alcohol Use, Marital History - Currently Married, Occupation:, Tobacco Use, Caffeine Use   REVIEW OF SYSTEMS:    GU Review Male:   Patient denies frequent urination, hard to postpone urination, burning/ pain with urination, get up at night to urinate, leakage of urine, stream starts and stops, trouble starting your stream, have to strain to urinate , erection problems, and penile pain.  Gastrointestinal (Upper):   Patient reports nausea. Patient denies vomiting and indigestion/ heartburn.  Gastrointestinal (Lower):   Patient reports diarrhea. Patient denies constipation.  Constitutional:   Patient denies fever, night sweats, weight loss, and fatigue.  Skin:   Patient denies skin rash/ lesion and itching.  Eyes:   Patient denies blurred vision and double vision.  Ears/ Nose/ Throat:   Patient denies sore throat and sinus problems.  Hematologic/Lymphatic:   Patient denies swollen glands and easy bruising.  Cardiovascular:   Patient denies leg swelling and chest pains.  Respiratory:   Patient reports cough. Patient denies shortness of breath.  Endocrine:   Patient denies excessive thirst.  Musculoskeletal:   Patient denies back pain and joint pain.  Neurological:   Patient denies headaches and dizziness.  Psychologic:   Patient denies depression and anxiety.   VITAL SIGNS:      05/25/2018 02:35 PM  Weight 204 lb / 92.53 kg  Height 68 in / 172.72 cm  BP 156/88 mmHg  Pulse 81 /min  Temperature 97.9 F / 36.6 C  BMI 31.0 kg/m   MULTI-SYSTEM  PHYSICAL EXAMINATION:    Constitutional: Well-nourished. No physical deformities. Normally developed. Good grooming.  Neck: Neck symmetrical, not swollen. Normal tracheal position.  Respiratory: No labored breathing, no use of accessory muscles.   Neurologic / Psychiatric: Oriented to time, oriented to place, oriented to person. No depression, no anxiety, no agitation.  Gastrointestinal: No mass,  no tenderness, no rigidity, non obese abdomen. +Right CVAT.  Musculoskeletal: Normal gait and station of head and neck.     PAST DATA REVIEWED:  Source Of History:  Patient  Lab Test Review:   PSA  Urine Test Review:   Urinalysis   02/07/07  PSA  Total PSA 0.25     PROCEDURES:         C.T. Urogram - [id number]  By stones are seen in the right kidney. The largest is in the renal pelvis and measures approximately 8 x 5 mm. No right ureteral stone. No right hydroureteronephrosis. A single stone is identified in the left kidney and the anterior interpolar region measuring 5 x 6 mm. No left ureteral stone. No left hydroureteronephrosis.       Patient confirmed No Neulasta OnPro Device.           Urinalysis w/Scope Dipstick Dipstick Cont'd Micro  Color: Yellow Bilirubin: Neg mg/dL WBC/hpf: 0 - 5/hpf  Appearance: Cloudy Ketones: Neg mg/dL RBC/hpf: >27/OZD  Specific Gravity: 1.020 Blood: 3+ ery/uL Bacteria: Rare (0-9/hpf)  pH: 5.5 Protein: Trace mg/dL Cystals: NS (Not Seen)  Glucose: Neg mg/dL Urobilinogen: 0.2 mg/dL Casts: NS (Not Seen)    Nitrites: Neg Trichomonas: Not Present    Leukocyte Esterase: Neg leu/uL Mucous: Present      Epithelial Cells: 0 - 5/hpf      Yeast: NS (Not Seen)      Sperm: Not Present    ASSESSMENT:      ICD-10 Details  1 GU:   Renal calculus - N20.0   2   Microscopic hematuria - R31.21    PLAN:            Medications New Meds: Tamsulosin Hcl 0.4 mg capsule 1 capsule PO Daily   #10  0 Refill(s)  Oxycodone-Acetaminophen 5 mg-325 mg tablet 1 tablet PO Q 6 H PRN   #10  0 Refill(s)  Zofran 8 mg tablet 1 tablet PO BID PRN   #10  0 Refill(s)            Orders Labs Urine Culture  X-Rays: C.T. Stone Protocol Without Contrast  X-Ray Notes: History:  Hematuria: Yes/No  Patient to see MD after exam: Yes/No  Previous exam: CT / IVP/ US/ KUB/ None  When:  Where:  Diabetic: Yes/ No  BUN/ Creatinine:  Date of last BUN Creatinine:  Weight in  pounds:  Allergy- IV Contrast: Yes/ No  Conflicting diabetic meds: Yes/ No  Diabetic Meds:  Prior Authorization #: No auth needed per Sallyanne Kuster with [employer] call ref:[id number]           Schedule         Document Letter(s):  Created for Patient: Clinical Summary         Notes:   This patient has bilateral nonobstructing renal calculi. He is symptomatic and CT scan indicates an 8 x 5 mm calculus at the right renal pelvis, which may be contributing to his discomfort. Urinalysis also indicates significant microscopic hematuria. I will order a urine culture and treat accordingly when his results are final. For now, I will prescribe Percocet p.r.n. pain,  Zofran p.r.n. nausea, and Flomax once daily. Per the patient, he previously tolerated these medications well. Will plan for ESWL if approved by the urologist on-call.

## 2018-05-26 NOTE — H&P (View-Only) (Signed)
I have kidney stones.  HPI: Wayne Forbes is a 58 year-old male established patient who is here for renal calculi.  This patient complains of right renal colic and nausea for approximately 3 hours. He also complains of dark urine. He denies frank, gross hematuria, dysuria, or difficulty urinating. He denies vomiting or fevers.   He also reports a history of influenza last week, and he has remained asymptomatic since then. Per the patient, his PCP "cleared him" and he has remained afebrile. He denies coughing or SOB.   The problem is on both sides. He is currently having back pain and nausea. He denies having flank pain, groin pain, vomiting, fever, and chills.     ALLERGIES: No Allergies    MEDICATIONS: Albuterol Sulfate  Fish Oil  Hydrocodone-Acetaminophen PRN  Vitamin B Complex  Vitamin D     GU PSH: ESWL - 2008      PSH Notes: Treatment Of Humerus / Arm, Lithotripsy, Cystourethroscopy   NON-GU PSH: Elbow Surgery (Unspecified) - 2014    GU PMH: Renal calculus - 02/18/2018, Nephrolithiasis, - 2014 Ureteral calculus (Stable) - 08/12/2016, Calculus of ureter, - 2014, Ureteral Stone, - 2014 Dysuria, Dysuria - 2014 Encounter for Prostate Cancer screening, Prostate cancer screening - 2014      PMH Notes:  2008-02-06 15:13:09 - Note: Normal Routine History And Physical Adult  2006-08-25 09:15:55 - Note: Urinary Calculus   NON-GU PMH: Personal history of other diseases of the digestive system, History of esophageal reflux - 2014 Fatty (change of) liver, not elsewhere classified    FAMILY HISTORY: Acute Myocardial Infarction - Father Family Health Status Number - Runs In Family nephrolithiasis - Sister, Father, Brother   SOCIAL HISTORY: Marital Status: Married Preferred Language: English; Ethnicity: Not Hispanic Or Latino; Race: White Current Smoking Status: Patient has never smoked.   Tobacco Use Assessment Completed: Used Tobacco in last 30 days? Does drink.  Drinks 2  caffeinated drinks per day.     Notes: Never A Smoker, Alcohol Use, Marital History - Currently Married, Occupation:, Tobacco Use, Caffeine Use   REVIEW OF SYSTEMS:    GU Review Male:   Patient denies frequent urination, hard to postpone urination, burning/ pain with urination, get up at night to urinate, leakage of urine, stream starts and stops, trouble starting your stream, have to strain to urinate , erection problems, and penile pain.  Gastrointestinal (Upper):   Patient reports nausea. Patient denies vomiting and indigestion/ heartburn.  Gastrointestinal (Lower):   Patient reports diarrhea. Patient denies constipation.  Constitutional:   Patient denies fever, night sweats, weight loss, and fatigue.  Skin:   Patient denies skin rash/ lesion and itching.  Eyes:   Patient denies blurred vision and double vision.  Ears/ Nose/ Throat:   Patient denies sore throat and sinus problems.  Hematologic/Lymphatic:   Patient denies swollen glands and easy bruising.  Cardiovascular:   Patient denies leg swelling and chest pains.  Respiratory:   Patient reports cough. Patient denies shortness of breath.  Endocrine:   Patient denies excessive thirst.  Musculoskeletal:   Patient denies back pain and joint pain.  Neurological:   Patient denies headaches and dizziness.  Psychologic:   Patient denies depression and anxiety.   VITAL SIGNS:      05/25/2018 02:35 PM  Weight 204 lb / 92.53 kg  Height 68 in / 172.72 cm  BP 156/88 mmHg  Pulse 81 /min  Temperature 97.9 F / 36.6 C  BMI 31.0 kg/m   MULTI-SYSTEM  PHYSICAL EXAMINATION:    Constitutional: Well-nourished. No physical deformities. Normally developed. Good grooming.  Neck: Neck symmetrical, not swollen. Normal tracheal position.  Respiratory: No labored breathing, no use of accessory muscles.   Neurologic / Psychiatric: Oriented to time, oriented to place, oriented to person. No depression, no anxiety, no agitation.  Gastrointestinal: No mass,  no tenderness, no rigidity, non obese abdomen. +Right CVAT.  Musculoskeletal: Normal gait and station of head and neck.     PAST DATA REVIEWED:  Source Of History:  Patient  Lab Test Review:   PSA  Urine Test Review:   Urinalysis   02/07/07  PSA  Total PSA 0.25     PROCEDURES:         C.T. Urogram - [id number]  By stones are seen in the right kidney. The largest is in the renal pelvis and measures approximately 8 x 5 mm. No right ureteral stone. No right hydroureteronephrosis. A single stone is identified in the left kidney and the anterior interpolar region measuring 5 x 6 mm. No left ureteral stone. No left hydroureteronephrosis.       Patient confirmed No Neulasta OnPro Device.           Urinalysis w/Scope Dipstick Dipstick Cont'd Micro  Color: Yellow Bilirubin: Neg mg/dL WBC/hpf: 0 - 5/hpf  Appearance: Cloudy Ketones: Neg mg/dL RBC/hpf: >27/OZD  Specific Gravity: 1.020 Blood: 3+ ery/uL Bacteria: Rare (0-9/hpf)  pH: 5.5 Protein: Trace mg/dL Cystals: NS (Not Seen)  Glucose: Neg mg/dL Urobilinogen: 0.2 mg/dL Casts: NS (Not Seen)    Nitrites: Neg Trichomonas: Not Present    Leukocyte Esterase: Neg leu/uL Mucous: Present      Epithelial Cells: 0 - 5/hpf      Yeast: NS (Not Seen)      Sperm: Not Present    ASSESSMENT:      ICD-10 Details  1 GU:   Renal calculus - N20.0   2   Microscopic hematuria - R31.21    PLAN:            Medications New Meds: Tamsulosin Hcl 0.4 mg capsule 1 capsule PO Daily   #10  0 Refill(s)  Oxycodone-Acetaminophen 5 mg-325 mg tablet 1 tablet PO Q 6 H PRN   #10  0 Refill(s)  Zofran 8 mg tablet 1 tablet PO BID PRN   #10  0 Refill(s)            Orders Labs Urine Culture  X-Rays: [facility] Protocol Without Contrast  X-Ray Notes: History:  Hematuria: Yes/No  Patient to see MD after exam: Yes/No  Previous exam: CT / IVP/ US/ KUB/ None  When:  Where:  Diabetic: Yes/ No  BUN/ Creatinine:  Date of last BUN Creatinine:  Weight in  pounds:  Allergy- IV Contrast: Yes/ No  Conflicting diabetic meds: Yes/ No  Diabetic Meds:  Prior Authorization #: No auth needed per Sallyanne Kuster with [employer] call ref:[id number]           Schedule         Document Letter(s):  Created for Patient: Clinical Summary         Notes:   This patient has bilateral nonobstructing renal calculi. He is symptomatic and CT scan indicates an 8 x 5 mm calculus at the right renal pelvis, which may be contributing to his discomfort. Urinalysis also indicates significant microscopic hematuria. I will order a urine culture and treat accordingly when his results are final. For now, I will prescribe Percocet p.r.n. pain,  Zofran p.r.n. nausea, and Flomax once daily. Per the patient, he previously tolerated these medications well. Will plan for ESWL if approved by the urologist on-call.   

## 2018-05-26 NOTE — Op Note (Signed)
Case was canceled because the patient had taken pepto-bismol yesterday.   We will reschedule.

## 2018-05-27 ENCOUNTER — Encounter (HOSPITAL_COMMUNITY): Payer: Self-pay | Admitting: Urology

## 2018-05-27 NOTE — Discharge Instructions (Signed)

## 2018-05-27 NOTE — Progress Notes (Signed)
Contacted pt in regards to arrival time with [facility] admitting on Monday 05/30/2018 for scheduled procedure. Pt aware to bring blue folder, no food or drink after midnight and to review medication list of meds not to be taken 48 to 72 hours prior to procedure. Pt verbalized understanding of bowel prep for day prior to procedure.

## 2018-05-30 ENCOUNTER — Other Ambulatory Visit: Payer: Self-pay

## 2018-05-30 ENCOUNTER — Encounter (HOSPITAL_COMMUNITY)
Admission: RE | Disposition: A | Discharge status: Home / Self Care | Payer: Self-pay | Source: Home / Self Care | Attending: Urology

## 2018-05-30 ENCOUNTER — Ambulatory Visit (HOSPITAL_COMMUNITY): Payer: BLUE CROSS/BLUE SHIELD

## 2018-05-30 ENCOUNTER — Encounter (HOSPITAL_COMMUNITY): Payer: Self-pay | Admitting: *Deleted

## 2018-05-30 ENCOUNTER — Ambulatory Visit (HOSPITAL_COMMUNITY)
Admission: RE | Admit: 2018-05-30 | Discharge: 2018-05-30 | Disposition: A | Discharge status: Home / Self Care | Payer: BLUE CROSS/BLUE SHIELD | Attending: Urology | Admitting: Urology

## 2018-05-30 DIAGNOSIS — Z87442 Personal history of urinary calculi: Secondary | ICD-10-CM | POA: Insufficient documentation

## 2018-05-30 DIAGNOSIS — Z8249 Family history of ischemic heart disease and other diseases of the circulatory system: Secondary | ICD-10-CM | POA: Diagnosis not present

## 2018-05-30 DIAGNOSIS — N200 Calculus of kidney: Secondary | ICD-10-CM

## 2018-05-30 DIAGNOSIS — G4730 Sleep apnea, unspecified: Secondary | ICD-10-CM | POA: Diagnosis not present

## 2018-05-30 DIAGNOSIS — Z79899 Other long term (current) drug therapy: Secondary | ICD-10-CM | POA: Diagnosis not present

## 2018-05-30 HISTORY — PX: EXTRACORPOREAL SHOCK WAVE LITHOTRIPSY: SHX1557

## 2018-05-30 SURGERY — LITHOTRIPSY, ESWL
Anesthesia: LOCAL | Laterality: Right

## 2018-05-30 MED ORDER — OXYCODONE HCL 10 MG PO TABS: 10.0000 mg | ORAL_TABLET | ORAL | 0 refills | Status: DC | PRN

## 2018-05-30 MED ORDER — TAMSULOSIN HCL 0.4 MG PO CAPS: 0.4000 mg | ORAL_CAPSULE | ORAL | 0 refills | Status: DC

## 2018-05-30 MED ORDER — CIPROFLOXACIN HCL 500 MG PO TABS
500.0000 mg | ORAL_TABLET | ORAL | Status: AC
  Administered 2018-05-30: 500 mg via ORAL
  Filled 2018-05-30: qty 1

## 2018-05-30 MED ORDER — DIAZEPAM 5 MG PO TABS
10.0000 mg | ORAL_TABLET | ORAL | Status: AC
  Administered 2018-05-30: 10 mg via ORAL
  Filled 2018-05-30: qty 2

## 2018-05-30 MED ORDER — SODIUM CHLORIDE 0.9 % IV SOLN
INTRAVENOUS | Status: DC
  Administered 2018-05-30: 07:00:00 via INTRAVENOUS

## 2018-05-30 MED ORDER — DIPHENHYDRAMINE HCL 25 MG PO CAPS
25.0000 mg | ORAL_CAPSULE | ORAL | Status: AC
  Administered 2018-05-30: 25 mg via ORAL
  Filled 2018-05-30: qty 1

## 2018-05-30 NOTE — Interval H&P Note (Signed)
History and Physical Interval Note:  05/30/2018 7:19 AM  Wayne Forbes  has presented today for surgery, with the diagnosis of RIGHT RENAL  STONE.  The various methods of treatment have been discussed with the patient and family. After consideration of risks, benefits and other options for treatment, the patient has consented to  Procedure(s): EXTRACORPOREAL SHOCK WAVE LITHOTRIPSY (ESWL) (Right) as a surgical intervention.  The patient's history has been reviewed, patient examined, no change in status, stable for surgery.  I have reviewed the patient's chart and labs.  Questions were answered to the patient's satisfaction.     Garnett Farm

## 2018-05-30 NOTE — Op Note (Signed)
See [facility] OP note scanned into chart. Also because of the size, density, location and other factors that cannot be anticipated I feel this will likely be a staged procedure. This fact supersedes any indication in the scanned [facility] operative note to the contrary.  

## 2018-05-31 ENCOUNTER — Encounter (HOSPITAL_COMMUNITY): Payer: Self-pay | Admitting: Urology

## 2018-06-07 ENCOUNTER — Ambulatory Visit: Payer: Self-pay | Admitting: Pulmonary Disease

## 2018-06-07 ENCOUNTER — Telehealth: Payer: Self-pay

## 2018-06-07 ENCOUNTER — Encounter: Payer: Self-pay | Admitting: Pulmonary Disease

## 2018-06-07 ENCOUNTER — Other Ambulatory Visit: Payer: Self-pay

## 2018-06-07 ENCOUNTER — Ambulatory Visit (INDEPENDENT_AMBULATORY_CARE_PROVIDER_SITE_OTHER): Payer: BLUE CROSS/BLUE SHIELD | Admitting: Pulmonary Disease

## 2018-06-07 DIAGNOSIS — N200 Calculus of kidney: Secondary | ICD-10-CM | POA: Diagnosis not present

## 2018-06-07 DIAGNOSIS — Z9989 Dependence on other enabling machines and devices: Secondary | ICD-10-CM

## 2018-06-07 DIAGNOSIS — G4733 Obstructive sleep apnea (adult) (pediatric): Secondary | ICD-10-CM | POA: Diagnosis not present

## 2018-06-07 NOTE — Patient Instructions (Addendum)
Continue CPAP Use   We recommend that you continue using your CPAP daily >>>Keep up the hard work using your device >>> Goal should be wearing this for the entire night that you are sleeping, at least 4 to 6 hours  Remember:  . Do not drive or operate heavy machinery if tired or drowsy.  . Please notify the supply company and office if you are unable to use your device regularly due to missing supplies or machine being broken.  . Work on maintaining a healthy weight and following your recommended nutrition plan  . Maintain proper daily exercise and movement  . Maintaining proper use of your device can also help improve management of other chronic illnesses such as: Blood pressure, blood sugars, and weight management.   BiPAP/ CPAP Cleaning:  >>>Clean weekly, with Dawn soap, and bottle brush.  Set up to air dry.  We will request a 70-month download from her DME company [employer]  We contacted [employer] who reported that they will contact you regarding your supplies  Return in about 4 months (around 10/07/2018), or if symptoms worsen or fail to improve, for Follow up with Dr. Everardo All.   Coronavirus (COVID-19) Are you at risk?  Are you at risk for the Coronavirus (COVID-19)?  To be considered HIGH RISK for Coronavirus (COVID-19), you have to meet the following criteria:  . Traveled to [place], [place], [place], [place] or [place]; or in the [place] to [place], [place], [place], or [place]; and have fever, cough, and shortness of breath within the last 2 weeks of travel OR . Been in close contact with a person diagnosed with COVID-19 within the last 2 weeks and have fever, cough, and shortness of breath . IF YOU DO NOT MEET THESE CRITERIA, YOU ARE CONSIDERED LOW RISK FOR COVID-19.  What to do if you are HIGH RISK for COVID-19?  Marland Kitchen If you are having a medical emergency, call 911. . Seek medical care right away. Before you go to a doctor's office, urgent care or emergency  department, call ahead and tell them about your recent travel, contact with someone diagnosed with COVID-19, and your symptoms. You should receive instructions from your physician's office regarding next steps of care.  . When you arrive at healthcare provider, tell the healthcare staff immediately you have returned from visiting [place], [place], [place], [place] or [place]; or traveled in the [place] to [place], [place], [place], or [place]; in the last two weeks or you have been in close contact with a person diagnosed with COVID-19 in the last 2 weeks.   . Tell the health care staff about your symptoms: fever, cough and shortness of breath. . After you have been seen by a medical provider, you will be either: o Tested for (COVID-19) and discharged home on quarantine except to seek medical care if symptoms worsen, and asked to  - Stay home and avoid contact with others until you get your results (4-5 days)  - Avoid travel on public transportation if possible (such as bus, train, or airplane) or o Sent to the Emergency Department by EMS for evaluation, COVID-19 testing, and possible admission depending on your condition and test results.  What to do if you are LOW RISK for COVID-19?  Reduce your risk of any infection by using the same precautions used for avoiding the common cold or flu:  Marland Kitchen Wash your hands often with soap and warm water for at least 20  seconds.  If soap and water are not readily available, use an alcohol-based hand sanitizer with at least 60% alcohol.  . If coughing or sneezing, cover your mouth and nose by coughing or sneezing into the elbow areas of your shirt or coat, into a tissue or into your sleeve (not your hands). . Avoid shaking hands with others and consider head nods or verbal greetings only. . Avoid touching your eyes, nose, or mouth with unwashed hands.  . Avoid close contact with people who are sick. . Avoid places or events with large numbers of people  in one location, like concerts or sporting events. . Carefully consider travel plans you have or are making. . If you are planning any travel outside or inside the [place], visit the CDC's Travelers' Health webpage for the latest health notices. . If you have some symptoms but not all symptoms, continue to monitor at home and seek medical attention if your symptoms worsen. . If you are having a medical emergency, call 911.   ADDITIONAL HEALTHCARE OPTIONS FOR PATIENTS  [facility] / e-Visit: [email or web address]         [facility]: [phone]  [facility]: [phone]                   [facility]: [phone]           It is flu season:   >>> Best ways to protect herself from the flu: Receive the yearly flu vaccine, practice good hand hygiene washing with soap and also using hand sanitizer when available, eat a nutritious meals, get adequate rest, hydrate appropriately   Please contact the office if your symptoms worsen or you have concerns that you are not improving.   Thank you for choosing [facility] for your healthcare, and for allowing [place] to partner with you on your healthcare journey. I am thankful to be able to provide care to you today.   Elisha Headland FNP-C

## 2018-06-07 NOTE — Telephone Encounter (Signed)
Patient had televisit today with Wayne Headland, FNP.  States he had difficulty getting supplies for CPAP.  Informed patient we would call [employer].  Spoke with [employer] account planner) who states patient had called supplier warehouse needing a few supplies and since his cpap compliance was low, he was probably told he shouldn't need anything.  She will contact him to see what he needs, explaining it seemed to be mostly filters but she would assist him.  Nothing further needed.

## 2018-06-07 NOTE — Progress Notes (Signed)
Virtual Visit via Telephone Note  I connected with Wayne SUMP on 06/07/18 at  4:00 PM EDT by telephone and verified that I am speaking with the correct person using two identifiers.   I discussed the limitations, risks, security and privacy concerns of performing an evaluation and management service by telephone and the availability of in person appointments. I also discussed with the patient that there may be a patient responsible charge related to this service. The patient expressed understanding and agreed to proceed.  06/07/2018 - 1549 - attempted to reach patient, left vm  06/07/2018 - 1559 - reached  History of Present Illness:  Patient consented to consult via telephone: Yes People present and their role in pt care: Pt  Chief complaint: Moderate OSA with CPAP   58 year old male never smoker followed in our office for moderate obstructive sleep apnea managed on CPAP.  Patient reports that since last office visit he has completed a CPAP titration he has tried a full facemask as well as tried the set pressure from the CPAP titration.  He did not tolerate the full facemask and now has switched to a nasal mask.  Patient also did not like the set pressure of 9.  He prefers the auto titrating 5-15.  He is currently using a nasal mask with the auto titrating pressure reports that this is been doing better.  He had adequate compliance in February as well as the beginning of March.  He had 1 brief episode of illness which caused him to not use the CPAP for about 4 to 5 days in March.  He also has had a kidney stone for the last week which is caused him to have interrupted adherence to CPAP.  His DME is [employer].   CPAP compliance report listed below:  04/18/2018-06/06/2018-CPAP compliance report-37 out of last 50 days use, 27 of those days greater than 4 hours, average usage 4 hours and 44 minutes, APAP pressure 5-15, AHI 1.5   Observations/Objective:  01/17/18 - Mild restrictive defect.  No obstruction or significant bronchodilator effect. Normal DLCO  Home sleep study from 01/17/18 - moderate obstructive sleep apnea with an AHI of 21.4 and SpO2 low of 78%.   03/21/2018 - CPAP Titration  >>>Trial of CPAP therapy on 9 cm H2O with a Medium Wide size Philips Respironics Full Face Mask Dreamwear mask and heated humidification.  02/22/2018-CT chest without contrast- no acute findings, old granulomatous disease  No results found for: NITRICOXIDE  Assessment and Plan:  Moderate obstructive sleep apnea Assessment: November/2019 home sleep study shows moderate obstructive sleep apnea with an AHI of 21.4 Adequate CPAP compliance but needs room for improvement on CPAP compliance report reviewed today Last weight in chart is 204 pounds Patient reports that he is done better tolerating CPAP with a nasal mask as well as auto titrating pressures  Plan: We will request a 56-month download from [employer] to check compliance We contacted [employer] who will contact the patient regarding his supplies Continue CPAP 97-month follow-up with Dr. Everardo All  Follow Up Instructions:  Return in about 4 months (around 10/07/2018), or if symptoms worsen or fail to improve, for Follow up with Dr. Everardo All.    I discussed the assessment and treatment plan with the patient. The patient was provided an opportunity to ask questions and all were answered. The patient agreed with the plan and demonstrated an understanding of the instructions.   The patient was advised to call back or seek an in-person  evaluation if the symptoms worsen or if the condition fails to improve as anticipated.  I provided 24 minutes of non-face-to-face time during this encounter.   Coral Ceo, NP

## 2018-06-07 NOTE — Assessment & Plan Note (Signed)
Assessment: November/2019 home sleep study shows moderate obstructive sleep apnea with an AHI of 21.4 Adequate CPAP compliance but needs room for improvement on CPAP compliance report reviewed today Last weight in chart is 204 pounds Patient reports that he is done better tolerating CPAP with a nasal mask as well as auto titrating pressures  Plan: We will request a 23-month download from [employer] to check compliance We contacted [employer] who will contact the patient regarding his supplies Continue CPAP 37-month follow-up with Dr. Everardo All

## 2018-06-14 DIAGNOSIS — N200 Calculus of kidney: Secondary | ICD-10-CM | POA: Diagnosis not present

## 2018-06-14 DIAGNOSIS — G4733 Obstructive sleep apnea (adult) (pediatric): Secondary | ICD-10-CM | POA: Diagnosis not present

## 2018-07-08 DIAGNOSIS — N200 Calculus of kidney: Secondary | ICD-10-CM | POA: Diagnosis not present

## 2018-07-14 DIAGNOSIS — G4733 Obstructive sleep apnea (adult) (pediatric): Secondary | ICD-10-CM | POA: Diagnosis not present

## 2018-08-31 DIAGNOSIS — M545 Low back pain: Secondary | ICD-10-CM | POA: Diagnosis not present

## 2018-08-31 DIAGNOSIS — M5136 Other intervertebral disc degeneration, lumbar region: Secondary | ICD-10-CM | POA: Diagnosis not present

## 2018-09-08 ENCOUNTER — Telehealth: Payer: Self-pay | Admitting: Pulmonary Disease

## 2018-09-08 MED ORDER — PROAIR RESPICLICK 108 (90 BASE) MCG/ACT IN AEPB: 2.0000 | INHALATION_SPRAY | Freq: Four times a day (QID) | RESPIRATORY_TRACT | 3 refills | Status: DC | PRN

## 2018-09-08 NOTE — Telephone Encounter (Signed)
Spoke with patient. He requested a refill on his ProAir to be sent to [facility] on [address]. Advised patient that I would send this in for him now. He verbalized understanding. Nothing further needed at time of call.

## 2018-09-13 DIAGNOSIS — G4733 Obstructive sleep apnea (adult) (pediatric): Secondary | ICD-10-CM | POA: Diagnosis not present

## 2018-10-21 DIAGNOSIS — Z8249 Family history of ischemic heart disease and other diseases of the circulatory system: Secondary | ICD-10-CM | POA: Diagnosis not present

## 2018-10-21 DIAGNOSIS — G4733 Obstructive sleep apnea (adult) (pediatric): Secondary | ICD-10-CM | POA: Diagnosis not present

## 2018-10-21 DIAGNOSIS — Z0000 Encounter for general adult medical examination without abnormal findings: Secondary | ICD-10-CM | POA: Diagnosis not present

## 2018-10-21 DIAGNOSIS — K7581 Nonalcoholic steatohepatitis (NASH): Secondary | ICD-10-CM | POA: Diagnosis not present

## 2018-10-21 DIAGNOSIS — R945 Abnormal results of liver function studies: Secondary | ICD-10-CM | POA: Diagnosis not present

## 2018-10-21 DIAGNOSIS — Z13220 Encounter for screening for lipoid disorders: Secondary | ICD-10-CM | POA: Diagnosis not present

## 2018-10-22 IMAGING — CT CT ABD-PELV W/O CM
1 of 3 series · 14 of 46 positions shown, 18 images · non-contrast
Comparison: CT of the abdomen and pelvis performed 09/06/2012, and
lumbar spine MRI performed 04/20/2015

CLINICAL DATA: Acute onset of lower pelvic and abdominal pain,
radiating to the groin. Nausea. Initial encounter.

EXAM:
CT ABDOMEN AND PELVIS WITHOUT CONTRAST
TECHNIQUE: Multidetector CT imaging of the abdomen and pelvis was performed
following the standard protocol without IV contrast.

[Series 6: lung · axial · 0.83mm/px · z∈[-455,-339]mm · 14 of 68 slices shown, 18 images]
[im 5/68  soft-tissue]
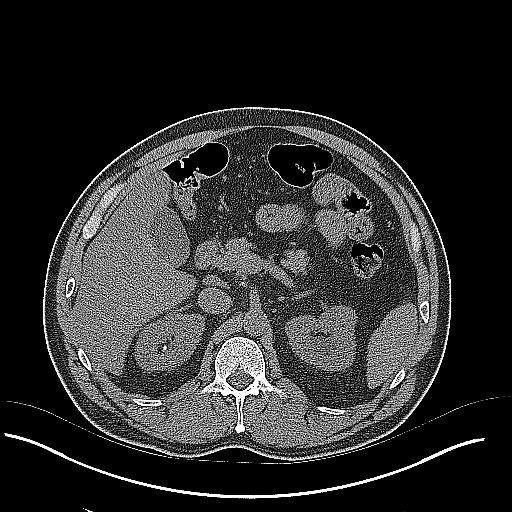
[im 5/68  lung]
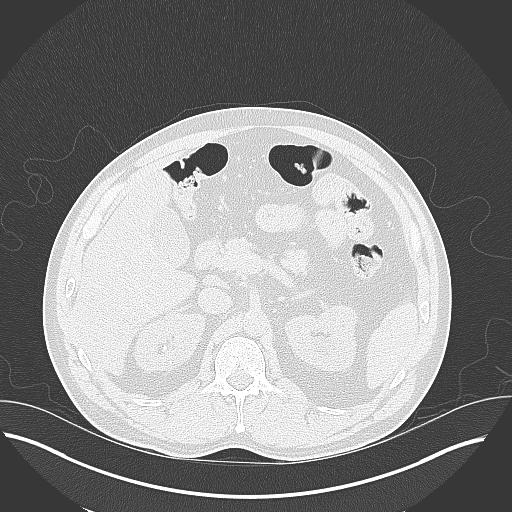
[im 9/68  lung]
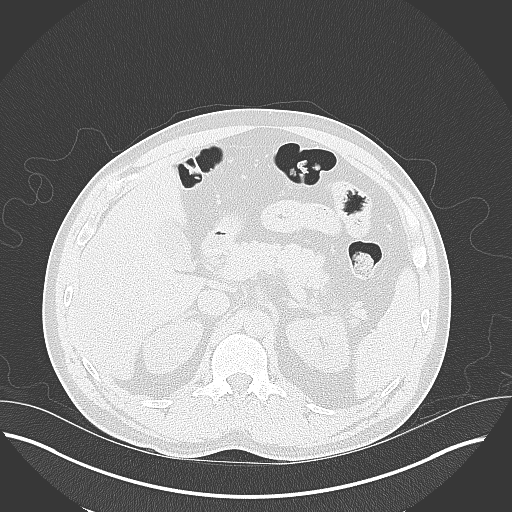
[im 13/68  lung]
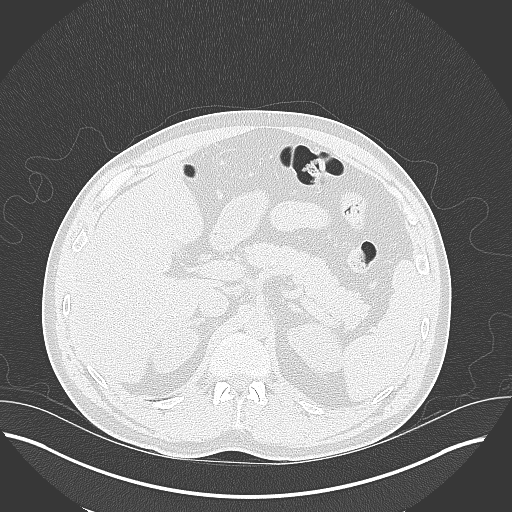
[im 18/68  lung]
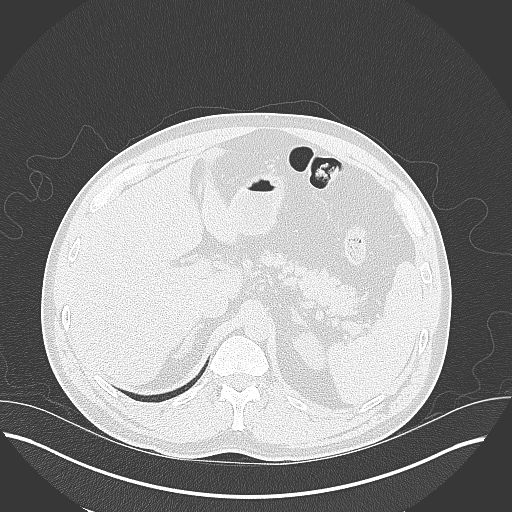
[im 22/68  soft-tissue]
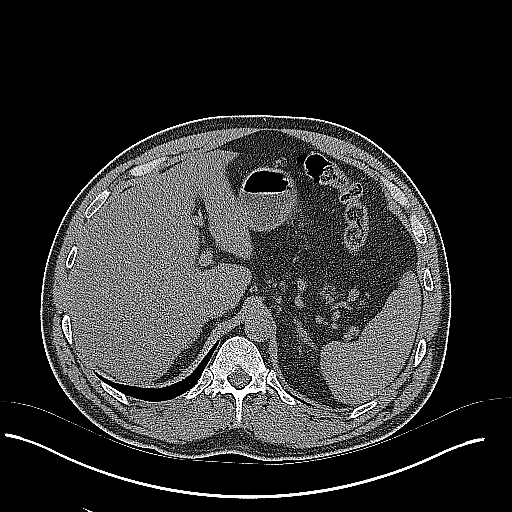
[im 22/68  lung]
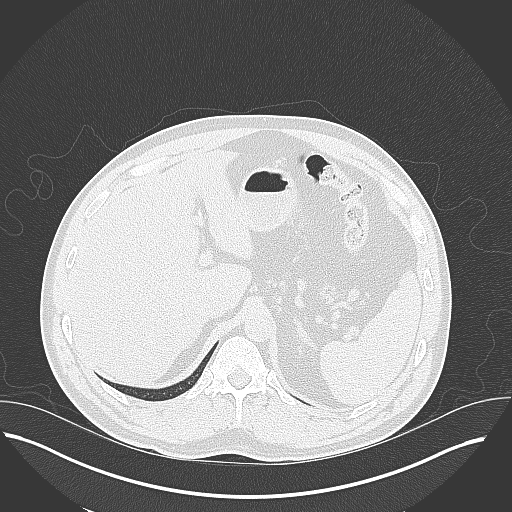
[im 26/68  lung]
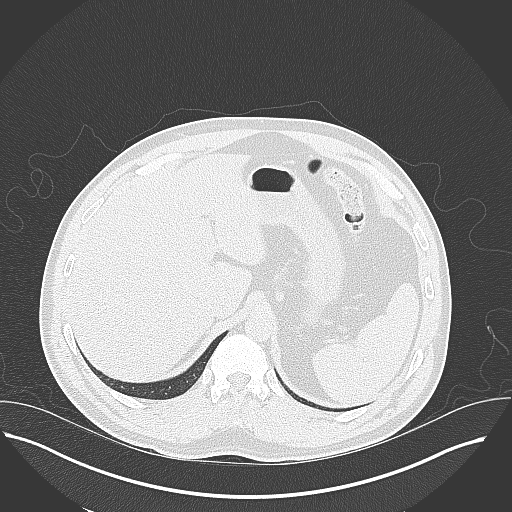
[im 31/68  lung]
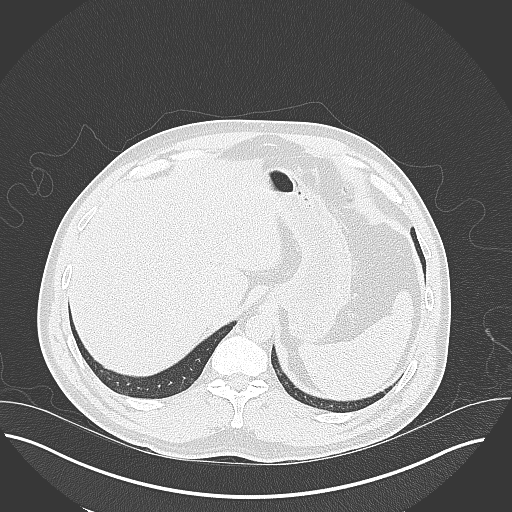
[im 37/68  lung]
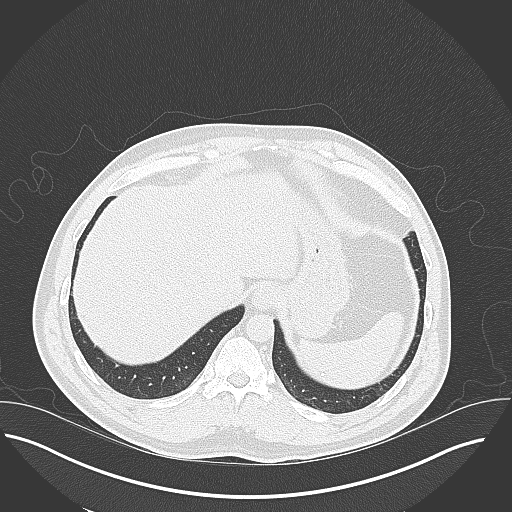
[im 42/68  soft-tissue]
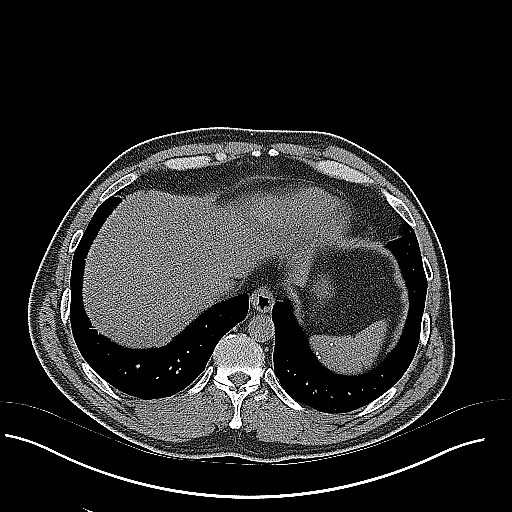
[im 42/68  lung]
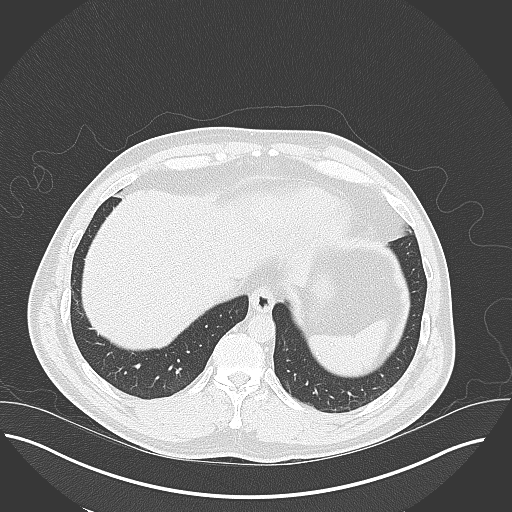
[im 46/68  lung]
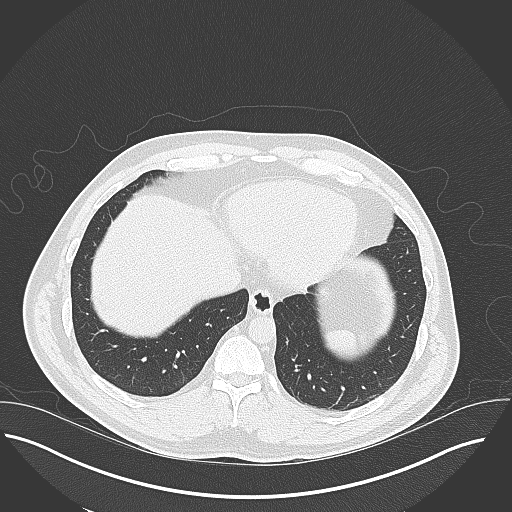
[im 50/68  lung]
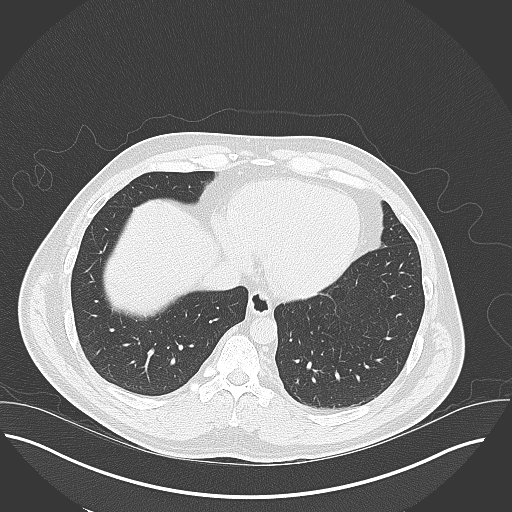
[im 55/68  lung]
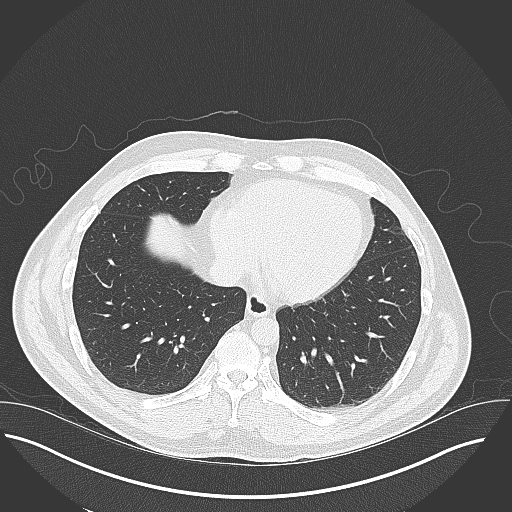
[im 59/68  soft-tissue]
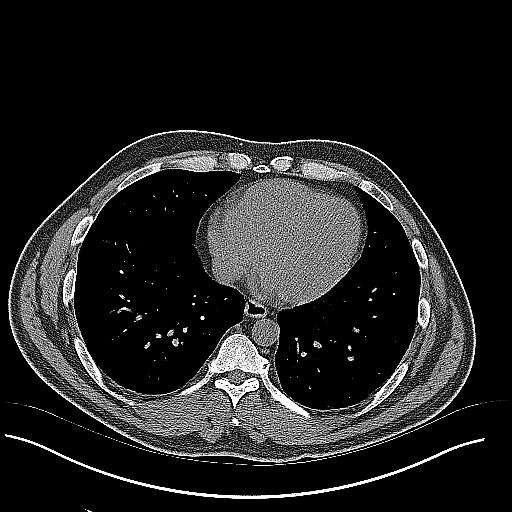
[im 59/68  lung]
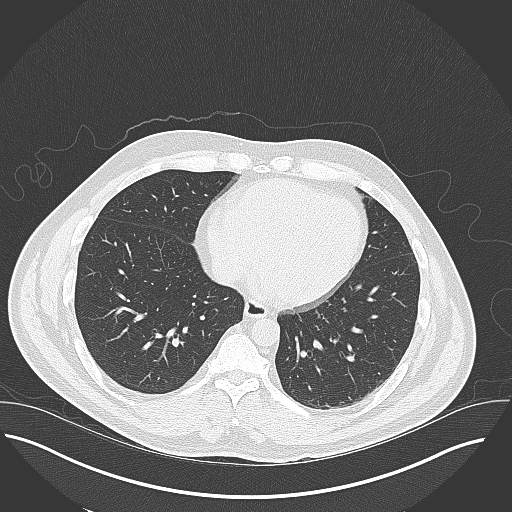
[im 63/68  lung]
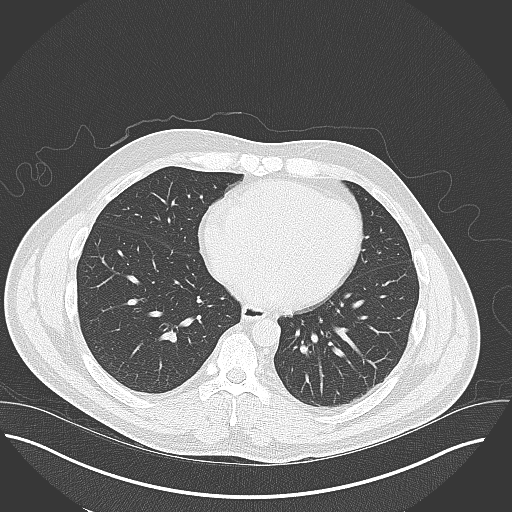

[14 of 46 positions shown; findings below may reference images not displayed]

FINDINGS: Lower chest: The visualized lung bases are grossly clear. The
visualized portions of the mediastinum are unremarkable.

Hepatobiliary: The liver is unremarkable in appearance. The
gallbladder is unremarkable in appearance. The common bile duct
remains normal in caliber.

Pancreas: The pancreas is within normal limits.

Spleen: Numerous calcified granulomata are seen within the spleen.

Adrenals/Urinary Tract: There is minimal left-sided hydronephrosis,
with mild left-sided perinephric stranding, and an obstructing 5 x 4
mm stone noted at the proximal left ureter, 4 cm below the left
renal pelvis. Nonobstructing bilateral renal stones measure up to 6
mm in size. Minimal nonspecific right-sided perinephric stranding is
noted.

Stomach/Bowel: The stomach is unremarkable in appearance. The small
bowel is within normal limits. The appendix is normal in caliber,
without evidence of appendicitis. The colon is unremarkable in
appearance.

Vascular/Lymphatic: Minimal calcification is noted at the distal
abdominal aorta. No retroperitoneal or pelvic sidewall
lymphadenopathy is seen.

Reproductive: The bladder is mildly distended and grossly
unremarkable. The prostate remains normal in size, with minimal
calcification.

Other: No additional soft tissue abnormalities are seen.

Musculoskeletal: No acute osseous abnormalities are identified.
Vacuum phenomenon is noted at L5-S1, with mild grade 1
retrolisthesis of L5 on S1. The visualized musculature is
unremarkable in appearance.
IMPRESSION: 1. Minimal left-sided hydronephrosis, with obstructing 5 x 4 mm
stone noted at the proximal left ureter, 4 cm below the left renal
pelvis.
2. Nonobstructing bilateral renal stones measure up to 6 mm in size.

## 2018-10-24 DIAGNOSIS — R7989 Other specified abnormal findings of blood chemistry: Secondary | ICD-10-CM | POA: Diagnosis not present

## 2018-10-24 DIAGNOSIS — R740 Nonspecific elevation of levels of transaminase and lactic acid dehydrogenase [LDH]: Secondary | ICD-10-CM | POA: Diagnosis not present

## 2018-10-25 ENCOUNTER — Other Ambulatory Visit: Payer: Self-pay | Admitting: Physician Assistant

## 2018-10-25 DIAGNOSIS — R7989 Other specified abnormal findings of blood chemistry: Secondary | ICD-10-CM

## 2018-10-26 DIAGNOSIS — R7989 Other specified abnormal findings of blood chemistry: Secondary | ICD-10-CM | POA: Diagnosis not present

## 2018-10-26 DIAGNOSIS — R740 Nonspecific elevation of levels of transaminase and lactic acid dehydrogenase [LDH]: Secondary | ICD-10-CM | POA: Diagnosis not present

## 2018-10-27 ENCOUNTER — Ambulatory Visit
Admission: RE | Admit: 2018-10-27 | Discharge: 2018-10-27 | Disposition: A | Discharge status: Home / Self Care | Payer: BC Managed Care – PPO | Source: Ambulatory Visit | Attending: Physician Assistant | Admitting: Physician Assistant

## 2018-10-27 DIAGNOSIS — R7989 Other specified abnormal findings of blood chemistry: Secondary | ICD-10-CM

## 2018-10-27 DIAGNOSIS — K760 Fatty (change of) liver, not elsewhere classified: Secondary | ICD-10-CM | POA: Diagnosis not present

## 2018-11-01 ENCOUNTER — Other Ambulatory Visit: Payer: BLUE CROSS/BLUE SHIELD

## 2018-11-21 DIAGNOSIS — J45991 Cough variant asthma: Secondary | ICD-10-CM | POA: Diagnosis not present

## 2018-11-22 DIAGNOSIS — Z1321 Encounter for screening for nutritional disorder: Secondary | ICD-10-CM | POA: Diagnosis not present

## 2018-11-22 DIAGNOSIS — R740 Nonspecific elevation of levels of transaminase and lactic acid dehydrogenase [LDH]: Secondary | ICD-10-CM | POA: Diagnosis not present

## 2018-12-15 DIAGNOSIS — G4733 Obstructive sleep apnea (adult) (pediatric): Secondary | ICD-10-CM | POA: Diagnosis not present

## 2018-12-27 DIAGNOSIS — R945 Abnormal results of liver function studies: Secondary | ICD-10-CM | POA: Diagnosis not present

## 2018-12-27 DIAGNOSIS — R7989 Other specified abnormal findings of blood chemistry: Secondary | ICD-10-CM | POA: Diagnosis not present

## 2018-12-28 DIAGNOSIS — Z23 Encounter for immunization: Secondary | ICD-10-CM | POA: Diagnosis not present

## 2019-01-11 DIAGNOSIS — R945 Abnormal results of liver function studies: Secondary | ICD-10-CM | POA: Diagnosis not present

## 2019-01-11 DIAGNOSIS — K760 Fatty (change of) liver, not elsewhere classified: Secondary | ICD-10-CM | POA: Diagnosis not present

## 2019-01-13 ENCOUNTER — Other Ambulatory Visit (HOSPITAL_COMMUNITY): Payer: Self-pay | Admitting: Gastroenterology

## 2019-01-13 ENCOUNTER — Other Ambulatory Visit: Payer: Self-pay | Admitting: Gastroenterology

## 2019-01-13 DIAGNOSIS — R7989 Other specified abnormal findings of blood chemistry: Secondary | ICD-10-CM

## 2019-01-18 DIAGNOSIS — M4316 Spondylolisthesis, lumbar region: Secondary | ICD-10-CM | POA: Diagnosis not present

## 2019-01-18 DIAGNOSIS — M5136 Other intervertebral disc degeneration, lumbar region: Secondary | ICD-10-CM | POA: Diagnosis not present

## 2019-01-25 ENCOUNTER — Other Ambulatory Visit: Payer: Self-pay | Admitting: Radiology

## 2019-01-25 DIAGNOSIS — G4733 Obstructive sleep apnea (adult) (pediatric): Secondary | ICD-10-CM | POA: Diagnosis not present

## 2019-01-27 ENCOUNTER — Ambulatory Visit (HOSPITAL_COMMUNITY): Admission: RE | Admit: 2019-01-27 | Payer: BC Managed Care – PPO | Source: Ambulatory Visit

## 2019-01-27 DIAGNOSIS — R945 Abnormal results of liver function studies: Secondary | ICD-10-CM | POA: Diagnosis not present

## 2019-02-07 DIAGNOSIS — M5440 Lumbago with sciatica, unspecified side: Secondary | ICD-10-CM | POA: Diagnosis not present

## 2019-02-09 ENCOUNTER — Ambulatory Visit (HOSPITAL_COMMUNITY): Payer: BC Managed Care – PPO

## 2019-02-09 ENCOUNTER — Encounter (HOSPITAL_COMMUNITY): Payer: Self-pay

## 2019-02-13 ENCOUNTER — Other Ambulatory Visit: Payer: Self-pay | Admitting: Neurosurgery

## 2019-02-13 DIAGNOSIS — M5440 Lumbago with sciatica, unspecified side: Secondary | ICD-10-CM

## 2019-02-21 ENCOUNTER — Ambulatory Visit
Admission: RE | Admit: 2019-02-21 | Discharge: 2019-02-21 | Disposition: A | Discharge status: Home / Self Care | Payer: BC Managed Care – PPO | Source: Ambulatory Visit | Attending: Neurosurgery | Admitting: Neurosurgery

## 2019-02-21 ENCOUNTER — Other Ambulatory Visit: Payer: Self-pay

## 2019-02-21 DIAGNOSIS — M5440 Lumbago with sciatica, unspecified side: Secondary | ICD-10-CM

## 2019-02-21 DIAGNOSIS — M48061 Spinal stenosis, lumbar region without neurogenic claudication: Secondary | ICD-10-CM | POA: Diagnosis not present

## 2019-02-28 DIAGNOSIS — G8929 Other chronic pain: Secondary | ICD-10-CM | POA: Diagnosis not present

## 2019-02-28 DIAGNOSIS — M545 Low back pain: Secondary | ICD-10-CM | POA: Diagnosis not present

## 2019-03-13 DIAGNOSIS — M545 Low back pain: Secondary | ICD-10-CM | POA: Diagnosis not present

## 2019-03-30 DIAGNOSIS — M545 Low back pain: Secondary | ICD-10-CM | POA: Diagnosis not present

## 2019-04-07 DIAGNOSIS — R899 Unspecified abnormal finding in specimens from other organs, systems and tissues: Secondary | ICD-10-CM | POA: Diagnosis not present

## 2019-04-17 DIAGNOSIS — M5136 Other intervertebral disc degeneration, lumbar region: Secondary | ICD-10-CM | POA: Diagnosis not present

## 2019-04-17 DIAGNOSIS — J45991 Cough variant asthma: Secondary | ICD-10-CM | POA: Diagnosis not present

## 2019-04-17 DIAGNOSIS — M4316 Spondylolisthesis, lumbar region: Secondary | ICD-10-CM | POA: Diagnosis not present

## 2019-04-17 DIAGNOSIS — G4733 Obstructive sleep apnea (adult) (pediatric): Secondary | ICD-10-CM | POA: Diagnosis not present

## 2019-04-27 DIAGNOSIS — G4733 Obstructive sleep apnea (adult) (pediatric): Secondary | ICD-10-CM | POA: Diagnosis not present

## 2019-05-08 DIAGNOSIS — K760 Fatty (change of) liver, not elsewhere classified: Secondary | ICD-10-CM | POA: Diagnosis not present

## 2019-05-08 DIAGNOSIS — R945 Abnormal results of liver function studies: Secondary | ICD-10-CM | POA: Diagnosis not present

## 2019-05-09 DIAGNOSIS — Z20828 Contact with and (suspected) exposure to other viral communicable diseases: Secondary | ICD-10-CM | POA: Diagnosis not present

## 2019-06-02 DIAGNOSIS — R945 Abnormal results of liver function studies: Secondary | ICD-10-CM | POA: Diagnosis not present

## 2019-06-15 DIAGNOSIS — M79673 Pain in unspecified foot: Secondary | ICD-10-CM | POA: Diagnosis not present

## 2019-07-18 DIAGNOSIS — K760 Fatty (change of) liver, not elsewhere classified: Secondary | ICD-10-CM | POA: Diagnosis not present

## 2019-07-18 DIAGNOSIS — K5900 Constipation, unspecified: Secondary | ICD-10-CM | POA: Diagnosis not present

## 2019-07-18 DIAGNOSIS — R945 Abnormal results of liver function studies: Secondary | ICD-10-CM | POA: Diagnosis not present

## 2019-07-20 ENCOUNTER — Other Ambulatory Visit (HOSPITAL_COMMUNITY): Payer: Self-pay | Admitting: Gastroenterology

## 2019-07-20 DIAGNOSIS — R7989 Other specified abnormal findings of blood chemistry: Secondary | ICD-10-CM

## 2019-07-31 ENCOUNTER — Other Ambulatory Visit: Payer: Self-pay

## 2019-07-31 ENCOUNTER — Ambulatory Visit (INDEPENDENT_AMBULATORY_CARE_PROVIDER_SITE_OTHER): Payer: BC Managed Care – PPO | Admitting: Podiatry

## 2019-07-31 ENCOUNTER — Ambulatory Visit (INDEPENDENT_AMBULATORY_CARE_PROVIDER_SITE_OTHER): Payer: BC Managed Care – PPO

## 2019-07-31 ENCOUNTER — Encounter: Payer: Self-pay | Admitting: Podiatry

## 2019-07-31 DIAGNOSIS — M79672 Pain in left foot: Secondary | ICD-10-CM | POA: Diagnosis not present

## 2019-07-31 DIAGNOSIS — M779 Enthesopathy, unspecified: Secondary | ICD-10-CM | POA: Diagnosis not present

## 2019-07-31 DIAGNOSIS — M79671 Pain in right foot: Secondary | ICD-10-CM | POA: Diagnosis not present

## 2019-07-31 DIAGNOSIS — N200 Calculus of kidney: Secondary | ICD-10-CM | POA: Insufficient documentation

## 2019-07-31 MED ORDER — DICLOFENAC SODIUM 1 % EX GEL: 2.0000 g | Freq: Four times a day (QID) | CUTANEOUS | 2 refills | Status: DC

## 2019-07-31 NOTE — Patient Instructions (Signed)
If was nice to meet you today. If you have any questions or any further concerns, please feel fee to give me a call. You can call our office at [phone] or please feel fee to send me a message through [place].   ----  Peroneal Tendinopathy Rehab Ask your health care provider which exercises are safe for you. Do exercises exactly as told by your health care provider and adjust them as directed. It is normal to feel mild stretching, pulling, tightness, or discomfort as you do these exercises. Stop right away if you feel sudden pain or your pain gets worse. Do not begin these exercises until told by your health care provider. Stretching and range-of-motion exercises These exercises warm up your muscles and joints and improve the movement and flexibility of your ankle. These exercises also help to relieve pain and stiffness. Gastroc and soleus stretch, standing This is an exercise in which you stand on a step and use your body weight to stretch your calf muscles. To do this exercise: 1. Stand on the edge of a step on the ball of your left / right foot. The ball of your foot is on the walking surface, right under your toes. 2. Keep your other foot firmly on the same step. 3. Hold on to the wall, a railing, or a chair for balance. 4. Slowly lift your other foot, allowing your body weight to press your left / right heel down over the edge of the step. You should feel a stretch in your left / right calf (gastrocnemius and soleus). 5. Hold this position for __________ seconds. 6. Return both feet to the step. 7. Repeat this exercise with a slight bend in your left / right knee. Repeat __________ times with your left / right knee straight and __________ times with your left / right knee bent. Complete this exercise __________ times a day. Strengthening exercises These exercises build strength and endurance in your foot and ankle. Endurance is the ability to use your muscles for a long time, even after  they get tired. Ankle dorsiflexion with band  1. Secure a rubber exercise band or tube to an object, such as a table leg, that will not move when the band is pulled. 2. Secure the other end of the band around your left / right foot. 3. Sit on the floor, facing the object with your left / right leg extended. The band or tube should be slightly tense when your foot is relaxed. 4. Slowly flex your left / right ankle and toes to bring your foot toward you (dorsiflexion). 5. Hold this position for __________ seconds. 6. Let the band or tube slowly pull your foot back to the starting position. Repeat __________ times. Complete this exercise __________ times a day. Ankle eversion 1. Sit on the floor with your legs straight out in front of you. 2. Loop a rubber exercise band or tube around the ball of your left / right foot. The ball of your foot is on the walking surface, right under your toes. 3. Hold the ends of the band in your hands, or secure the band to a stable object. The band or tube should be slightly tense when your foot is relaxed. 4. Slowly push your foot outward, away from your other leg (eversion). 5. Hold this position for __________ seconds. 6. Slowly return your foot to the starting position. Repeat __________ times. Complete this exercise __________ times a day. Plantar flexion, standing This exercise is sometimes called standing  heel raise. 1. Stand with your feet shoulder-width apart. 2. Place your hands on a wall or table to steady yourself as needed, but try not to use it for support. 3. Keep your weight spread evenly over the width of your feet while you slowly rise up on your toes (plantar flexion). If told by your health care provider: ? Shift your weight toward your left / right leg until you feel challenged. ? Stand on your left / right leg only. 4. Hold this position for __________ seconds. Repeat __________ times. Complete this exercise __________ times a day. Single  leg stand 1. Without shoes, stand near a railing or in a doorway. You may hold on to the railing or door frame as needed. 2. Stand on your left / right foot. Keep your big toe down on the floor and try to keep your arch lifted. ? Do not roll to the outside of your foot. ? If this exercise is too easy, you can try it with your eyes closed or while standing on a pillow. 3. Hold this position for __________ seconds. Repeat __________ times. Complete this exercise __________ times a day. This information is not intended to replace advice given to you by your health care provider. Make sure you discuss any questions you have with your health care provider. Document Revised: 06/14/2018 Document Reviewed: 06/14/2018 Elsevier Patient Education  2020 [employer].

## 2019-08-01 ENCOUNTER — Other Ambulatory Visit: Payer: Self-pay | Admitting: Podiatry

## 2019-08-01 DIAGNOSIS — M779 Enthesopathy, unspecified: Secondary | ICD-10-CM

## 2019-08-01 NOTE — Progress Notes (Signed)
Subjective:   Patient ID: Wayne Forbes, male   DOB: 59 y.o.   MRN: [id number]   HPI 59 year old male presents the office today for concerns of discomfort to his left foot worse than right which started about 6 weeks ago when application.  He was playing pickle ball when he noticed discomfort to his foot.  Since then he has had discomfort lateral aspect of the foot.  He has not had any swelling denies any recent injury to the left foot.  There is similar discomfort on the right side but not as significant in the same spot. Has been having some cream on his foot which been somewhat helpful.  No other treatment.  No other concerns.   Review of Systems  All other systems reviewed and are negative.  Past Medical History:  Diagnosis Date  . Apnea, sleep    does not wear cpap all time  . Fatty liver 2019  . Kidney stone   . Kidney stones     Past Surgical History:  Procedure Laterality Date  . EXTRACORPOREAL SHOCK WAVE LITHOTRIPSY Right 05/26/2018   Procedure: EXTRACORPOREAL SHOCK WAVE LITHOTRIPSY (ESWL);  Surgeon: [name], MD;  Location: [facility];  Service: Urology;  Laterality: Right;  . EXTRACORPOREAL SHOCK WAVE LITHOTRIPSY Right 05/30/2018   Procedure: EXTRACORPOREAL SHOCK WAVE LITHOTRIPSY (ESWL);  Surgeon: [name], MD;  Location: [facility];  Service: Urology;  Laterality: Right;  . TENDON REPAIR Right    arm     Current Outpatient Medications:  .  budesonide-formoterol (SYMBICORT) 160-4.5 MCG/ACT inhaler, Inhale 1 puff into the lungs 2 (two) times daily., Disp: , Rfl:  .  diclofenac Sodium (VOLTAREN) 1 % GEL, Apply 2 g topically 4 (four) times daily. Rub into affected area of foot 2 to 4 times daily, Disp: 100 g, Rfl: 2 .  Melatonin 5 MG CAPS, Take 5 mg by mouth at bedtime., Disp: , Rfl:  .  oxyCODONE (OXY IR/ROXICODONE) 5 MG immediate release tablet, Take 5 mg by mouth every 6 (six) hours as needed for pain., Disp: , Rfl:  .  Probiotic CAPS, Take 1 capsule by  mouth daily., Disp: , Rfl:  .  valACYclovir (VALTREX) 500 MG tablet, Take 1,000 mg by mouth daily as needed (fever blisters)., Disp: , Rfl:   No Known Allergies       Objective:  Physical Exam  General: AAO x3, NAD  Dermatological: Skin is warm, dry and supple bilateral. Nails x 10 are well manicured; remaining integument appears unremarkable at this time. There are no open sores, no preulcerative lesions, no rash or signs of infection present.  Vascular: Dorsalis Pedis artery and Posterior Tibial artery pedal pulses are 2/4 bilateral with immedate capillary fill time. Pedal hair growth present. No varicosities and no lower extremity edema present bilateral. There is no pain with calf compression, swelling, warmth, erythema.   Neruologic: Grossly intact via light touch bilateral.   Musculoskeletal: Mild tenderness palpation of the fifth metatarsal bases bilaterally left side worse than right.  There is no pain over the course the peroneal tendon otherwise and there is no edema, erythema.  Flexor, extensor tendons appear to be intact.  No other areas of discomfort identified on today's exam.  Muscular strength 5/5 in all groups tested bilateral.  Cavus foot type is present and upon weightbearing evaluation calcaneus is in varus.   Gait: Unassisted, Nonantalgic.       Assessment:   Fifth metatarsal base pain, insertional peroneal tendinitis  Plan:  -Treatment options discussed including all alternatives, risks, and complications -Etiology of symptoms were discussed -X-rays were obtained and reviewed with the patient.  No evidence of acute fracture or stress fracture.  Small spur on the fifth metatarsal base on the right side. -I do think his symptoms are multifactorial stemming from the change in activity as well as his overall foot position.  I did add a lateral wedge to the insert to have issues to prevent him from rolling out.  Voltaren gel ordered.  Ice the area as well as  stretching exercises for peroneal tendinitis discussed.  Consider steroid injection if needed next appointment.  Return in about 6 weeks (around 09/11/2019).  Trula Slade DPM

## 2019-08-03 ENCOUNTER — Other Ambulatory Visit: Payer: Self-pay | Admitting: Radiology

## 2019-08-03 ENCOUNTER — Other Ambulatory Visit: Payer: Self-pay | Admitting: Physician Assistant

## 2019-08-03 DIAGNOSIS — R1031 Right lower quadrant pain: Secondary | ICD-10-CM | POA: Diagnosis not present

## 2019-08-04 ENCOUNTER — Other Ambulatory Visit: Payer: Self-pay

## 2019-08-04 ENCOUNTER — Ambulatory Visit (HOSPITAL_COMMUNITY)
Admission: RE | Admit: 2019-08-04 | Discharge: 2019-08-04 | Disposition: A | Discharge status: Home / Self Care | Payer: BC Managed Care – PPO | Source: Ambulatory Visit | Attending: Gastroenterology | Admitting: Gastroenterology

## 2019-08-04 ENCOUNTER — Encounter (HOSPITAL_COMMUNITY): Payer: Self-pay

## 2019-08-04 DIAGNOSIS — K7581 Nonalcoholic steatohepatitis (NASH): Secondary | ICD-10-CM | POA: Insufficient documentation

## 2019-08-04 DIAGNOSIS — Z7951 Long term (current) use of inhaled steroids: Secondary | ICD-10-CM | POA: Diagnosis not present

## 2019-08-04 DIAGNOSIS — Z87442 Personal history of urinary calculi: Secondary | ICD-10-CM | POA: Diagnosis not present

## 2019-08-04 DIAGNOSIS — Z841 Family history of disorders of kidney and ureter: Secondary | ICD-10-CM | POA: Insufficient documentation

## 2019-08-04 DIAGNOSIS — Z8249 Family history of ischemic heart disease and other diseases of the circulatory system: Secondary | ICD-10-CM | POA: Diagnosis not present

## 2019-08-04 DIAGNOSIS — R748 Abnormal levels of other serum enzymes: Secondary | ICD-10-CM | POA: Diagnosis not present

## 2019-08-04 DIAGNOSIS — Z79899 Other long term (current) drug therapy: Secondary | ICD-10-CM | POA: Diagnosis not present

## 2019-08-04 DIAGNOSIS — R7989 Other specified abnormal findings of blood chemistry: Secondary | ICD-10-CM

## 2019-08-04 DIAGNOSIS — G4730 Sleep apnea, unspecified: Secondary | ICD-10-CM | POA: Insufficient documentation

## 2019-08-04 LAB — PROTIME-INR
INR: 1 (ref 0.8–1.2)
Prothrombin Time: 13.2 seconds (ref 11.4–15.2)

## 2019-08-04 LAB — CBC
HCT: 44.1 % (ref 39.0–52.0)
Hemoglobin: 15.4 g/dL (ref 13.0–17.0)
MCH: 32 pg (ref 26.0–34.0)
MCHC: 34.9 g/dL (ref 30.0–36.0)
MCV: 91.7 fL (ref 80.0–100.0)
Platelets: 173 10*3/uL (ref 150–400)
RBC: 4.81 MIL/uL (ref 4.22–5.81)
RDW: 11.6 % (ref 11.5–15.5)
WBC: 5.5 10*3/uL (ref 4.0–10.5)
nRBC: 0 % (ref 0.0–0.2)

## 2019-08-04 MED ORDER — MIDAZOLAM HCL 2 MG/2ML IJ SOLN
INTRAMUSCULAR | Status: AC
  Filled 2019-08-04: qty 2

## 2019-08-04 MED ORDER — HYDROCODONE-ACETAMINOPHEN 5-325 MG PO TABS
1.0000 | ORAL_TABLET | ORAL | Status: DC | PRN
  Administered 2019-08-04: 1 via ORAL

## 2019-08-04 MED ORDER — LIDOCAINE HCL (PF) 1 % IJ SOLN
INTRAMUSCULAR | Status: AC
  Filled 2019-08-04: qty 30

## 2019-08-04 MED ORDER — FENTANYL CITRATE (PF) 100 MCG/2ML IJ SOLN
INTRAMUSCULAR | Status: AC
  Filled 2019-08-04: qty 2

## 2019-08-04 MED ORDER — SODIUM CHLORIDE 0.9 % IV SOLN: INTRAVENOUS | Status: DC

## 2019-08-04 MED ORDER — FENTANYL CITRATE (PF) 100 MCG/2ML IJ SOLN
INTRAMUSCULAR | Status: AC | PRN
  Administered 2019-08-04 (×2): 50 ug via INTRAVENOUS

## 2019-08-04 MED ORDER — MIDAZOLAM HCL 2 MG/2ML IJ SOLN
INTRAMUSCULAR | Status: AC | PRN
  Administered 2019-08-04 (×2): 1 mg via INTRAVENOUS

## 2019-08-04 MED ORDER — HYDROCODONE-ACETAMINOPHEN 5-325 MG PO TABS
ORAL_TABLET | ORAL | Status: AC
  Administered 2019-08-04: 1 via ORAL
  Filled 2019-08-04: qty 1

## 2019-08-04 MED ORDER — HYDROCODONE-ACETAMINOPHEN 5-325 MG PO TABS
ORAL_TABLET | ORAL | Status: AC
  Filled 2019-08-04: qty 1

## 2019-08-04 MED ORDER — GELATIN ABSORBABLE 12-7 MM EX MISC
CUTANEOUS | Status: AC
  Filled 2019-08-04: qty 1

## 2019-08-04 NOTE — Discharge Instructions (Addendum)
Liver Biopsy, Care After These instructions give you information about how to care for yourself after your procedure. Your health care provider may also give you more specific instructions. If you have problems or questions, contact your health care provider. What can I expect after the procedure? After your procedure, it is common to have:  Pain and soreness in the area where the biopsy was done.  Bruising around the area where the biopsy was done.  Sleepiness and fatigue for 1-2 days. Follow these instructions at home: Medicines  Take over-the-counter and prescription medicines only as told by your health care provider.  If you were prescribed an antibiotic medicine, take it as told by your health care provider. Do not stop taking the antibiotic even if you start to feel better.  Do not take medicines such as aspirin and ibuprofen unless your health care provider tells you to take them. These medicines thin your blood and can increase the risk of bleeding.  If you are taking prescription pain medicine, take actions to prevent or treat constipation. Your health care provider may recommend that you: ? Drink enough fluid to keep your urine pale yellow. ? Eat foods that are high in fiber, such as fresh fruits and vegetables, whole grains, and beans. ? Limit foods that are high in fat and processed sugars, such as fried or sweet foods. ? Take an over-the-counter or prescription medicine for constipation. Incision care  Follow instructions from your health care provider about how to take care of your incision. Make sure you: ? Wash your hands with soap and water before you change your bandage (dressing). If soap and water are not available, use hand sanitizer. ? Change your dressing as told by your health care provider. ? Leave stitches (sutures), skin glue, or adhesive strips in place. These skin closures may need to stay in place for 2 weeks or longer. If adhesive strip edges start to  loosen and curl up, you may trim the loose edges. Do not remove adhesive strips completely unless your health care provider tells you to do that.  Check your incision area every day for signs of infection. Check for: ? Redness, swelling, or pain. ? Fluid or blood. ? Warmth. ? Pus or a bad smell.  Do not take baths, swim, or use a hot tub until your health care provider says it is okay to do so. Activity   Rest at home for 1-2 days, or as directed by your health care provider. ? Avoid sitting for a long time without moving. Get up to take short walks every 1-2 hours. This is important to improve blood flow and breathing. Ask for help if you feel weak or unsteady.  Return to your normal activities as told by your health care provider. Ask your health care provider what activities are safe for you.  Do not drive or use heavy machinery while taking prescription pain medicine.  Do not lift anything that is heavier than 10 lb (4.5 kg), or the limit that your health care provider tells you, until he or she says that it is safe.  Do not play contact sports for 2 weeks after the procedure. General instructions   Do not drink alcohol in the first week after the procedure.  Have someone stay with you for at least 24 hours after the procedure.  It is your responsibility to obtain your test results. Ask your health care provider, or the department that is doing the test: ? When will my   results be ready? ? How will I get my results? ? What are my treatment options? ? What other tests do I need? ? What are my next steps?  Keep all follow-up visits as told by your health care provider. This is important. Contact a health care provider if:  You have increased bleeding from an incision, resulting in more than a small spot of blood.  You have redness, swelling, or increasing pain in any incisions.  You notice a discharge or a bad smell coming from any of your incisions.  You have a fever or  chills. Get help right away if:  You develop swelling, bloating, or pain in your abdomen.  You become dizzy or faint.  You develop a rash.  You have nausea or you vomit.  You faint, or you have shortness of breath or difficulty breathing.  You develop chest pain.  You have problems with your speech or vision.  You have trouble with your balance or moving your arms or legs. Summary  After the liver biopsy, it is common to have pain, soreness, and bruising in the area, as well as sleepiness and fatigue.  Take over-the-counter and prescription medicines only as told by your health care provider.  Follow instructions from your health care provider about how to care for your incision. Check the incision area daily for signs of infection. This information is not intended to replace advice given to you by your health care provider. Make sure you discuss any questions you have with your health care provider. Document Revised: 04/18/2018 Document Reviewed: 03/05/2017 Elsevier Patient Education  2020 [employer] Moderate Conscious Sedation, Adult Sedation is the use of medicines to promote relaxation and relieve discomfort and anxiety. Moderate conscious sedation is a type of sedation. Under moderate conscious sedation, you are less alert than normal, but you are still able to respond to instructions, touch, or both. Moderate conscious sedation is used during short medical and dental procedures. It is milder than deep sedation, which is a type of sedation under which you cannot be easily woken up. It is also milder than general anesthesia, which is the use of medicines to make you unconscious. Moderate conscious sedation allows you to return to your regular activities sooner. Tell a health care provider about:  Any allergies you have.  All medicines you are taking, including vitamins, herbs, eye drops, creams, and over-the-counter medicines.  Use of steroids (by mouth or creams).  Any  problems you or family members have had with sedatives and anesthetic medicines.  Any blood disorders you have.  Any surgeries you have had.  Any medical conditions you have, such as sleep apnea.  Whether you are pregnant or may be pregnant.  Any use of cigarettes, alcohol, marijuana, or street drugs. What are the risks? Generally, this is a safe procedure. However, problems may occur, including:  Getting too much medicine (oversedation).  Nausea.  Allergic reaction to medicines.  Trouble breathing. If this happens, a breathing tube may be used to help with breathing. It will be removed when you are awake and breathing on your own.  Heart trouble.  Lung trouble. What happens before the procedure? Staying hydrated Follow instructions from your health care provider about hydration, which may include:  Up to 2 hours before the procedure - you may continue to drink clear liquids, such as water, clear fruit juice, black coffee, and plain tea. Eating and drinking restrictions Follow instructions from your health care provider about eating and drinking, which   may include:  8 hours before the procedure - stop eating heavy meals or foods such as meat, fried foods, or fatty foods.  6 hours before the procedure - stop eating light meals or foods, such as toast or cereal.  6 hours before the procedure - stop drinking milk or drinks that contain milk.  2 hours before the procedure - stop drinking clear liquids. Medicine Ask your health care provider about:  Changing or stopping your regular medicines. This is especially important if you are taking diabetes medicines or blood thinners.  Taking medicines such as aspirin and ibuprofen. These medicines can thin your blood. Do not take these medicines before your procedure if your health care provider instructs you not to.  Tests and exams  You will have a physical exam.  You may have blood tests done to show: ? How well your  kidneys and liver are working. ? How well your blood can clot. General instructions  Plan to have someone take you home from the hospital or clinic.  If you will be going home right after the procedure, plan to have someone with you for 24 hours. What happens during the procedure?  An IV tube will be inserted into one of your veins.  Medicine to help you relax (sedative) will be given through the IV tube.  The medical or dental procedure will be performed. What happens after the procedure?  Your blood pressure, heart rate, breathing rate, and blood oxygen level will be monitored often until the medicines you were given have worn off.  Do not drive for 24 hours. This information is not intended to replace advice given to you by your health care provider. Make sure you discuss any questions you have with your health care provider. Document Revised: 02/05/2017 Document Reviewed: 06/15/2015 Elsevier Patient Education  2020 [employer]  

## 2019-08-04 NOTE — Sedation Documentation (Signed)
Dr. Fredia Sorrow informed of patient complaint of right sided flank pain around biopsy site. Patient rating pain 4/10. Order written for PO pain medication.

## 2019-08-04 NOTE — H&P (Signed)
Chief Complaint: Patient was seen in consultation today for elevated LFTs  Referring Physician(s): [id number]  Supervising Physician: Irish Lack  Patient Status: Wayne Forbes - Out-pt  History of Present Illness: Wayne Forbes is a 59 y.o. male with no significant past medical history, known to IR from random liver biopsy 03/19/2017 by Dr. Lowella Dandy which revealed mild steatosis.  His liver enzymes remain persistently elevated and he is referred for random liver biopsy today by Dr. Levora Angel.   Wayne Forbes presents in his usual state of health today.  He denies fever, chills, nausea, vomiting, abdominal pain, dysuria. He has been NPO. He does not take blood thiners.   Past Medical History:  Diagnosis Date  . Apnea, sleep    does not wear cpap all time  . Fatty liver 2019  . Kidney stone   . Kidney stones     Past Surgical History:  Procedure Laterality Date  . EXTRACORPOREAL SHOCK WAVE LITHOTRIPSY Right 05/26/2018   Procedure: EXTRACORPOREAL SHOCK WAVE LITHOTRIPSY (ESWL);  Surgeon: [name], MD;  Location: [facility];  Service: Urology;  Laterality: Right;  . EXTRACORPOREAL SHOCK WAVE LITHOTRIPSY Right 05/30/2018   Procedure: EXTRACORPOREAL SHOCK WAVE LITHOTRIPSY (ESWL);  Surgeon: [name], MD;  Location: [facility];  Service: Urology;  Laterality: Right;  . TENDON REPAIR Right    arm    Allergies: Patient has no known allergies.  Medications: Prior to Admission medications   Medication Sig Start Date End Date Taking? Authorizing Provider  budesonide-formoterol (SYMBICORT) 160-4.5 MCG/ACT inhaler Inhale 1 puff into the lungs 2 (two) times daily.   Yes [provider]  Melatonin 5 MG CAPS Take 5 mg by mouth at bedtime.   Yes [provider]  oxyCODONE (OXY IR/ROXICODONE) 5 MG immediate release tablet Take 5 mg by mouth every 6 (six) hours as needed for pain. 02/14/19  Yes [provider]  Probiotic CAPS Take 1 capsule by mouth  daily.   Yes [provider]  diclofenac Sodium (VOLTAREN) 1 % GEL Apply 2 g topically 4 (four) times daily. Rub into affected area of foot 2 to 4 times daily 07/31/19   [name], DPM  valACYclovir (VALTREX) 500 MG tablet Take 1,000 mg by mouth daily as needed (fever blisters).    [provider]     Family History  Problem Relation Age of Onset  . Congestive Heart Failure Mother   . Heart disease Father   . Kidney failure Father     Social History   Socioeconomic History  . Marital status: Married    Spouse name: Not on file  . Number of children: Not on file  . Years of education: Not on file  . Highest education level: Not on file  Occupational History  . Not on file  Tobacco Use  . Smoking status: Never Smoker  . Smokeless tobacco: Never Used  Substance and Sexual Activity  . Alcohol use: Yes    Comment: weekend -socially beer  . Drug use: No  . Sexual activity: Not on file  Other Topics Concern  . Not on file  Social History Narrative  . Not on file   Social Determinants of Health   Financial Resource Strain:   . Difficulty of Paying Living Expenses:   Food Insecurity:   . Worried About Programme researcher, broadcasting/film/video in the Last Year:   . Barista in the Last Year:   Transportation Needs:   . Freight forwarder (Medical):   Marland Kitchen  Lack of Transportation (Non-Medical):   Physical Activity:   . Days of Exercise per Week:   . Minutes of Exercise per Session:   Stress:   . Feeling of Stress :   Social Connections:   . Frequency of Communication with Friends and Family:   . Frequency of Social Gatherings with Friends and Family:   . Attends Religious Services:   . Active Member of Clubs or Organizations:   . Attends Archivist Meetings:   Marland Kitchen Marital Status:      Review of Systems: A 12 point ROS discussed and pertinent positives are indicated in the HPI above.  All other systems are negative.  Review of Systems    Constitutional: Negative for fatigue and fever.  Respiratory: Negative for cough and shortness of breath.   Cardiovascular: Negative for chest pain.  Gastrointestinal: Negative for abdominal pain, diarrhea, nausea and vomiting.  Genitourinary: Negative for dysuria.  Musculoskeletal: Negative for back pain.  Psychiatric/Behavioral: Negative for behavioral problems and confusion.    Vital Signs: BP (!) 143/94   Pulse 77   Temp 97.9 F (36.6 C) (Oral)   Resp 16   Ht 5\' 8"  (1.727 m)   Wt 205 lb (93 kg)   SpO2 97%   BMI 31.17 kg/m   Physical Exam Vitals and nursing note reviewed.  Constitutional:      General: He is not in acute distress.    Appearance: Normal appearance. He is not ill-appearing.  HENT:     Mouth/Throat:     Mouth: Mucous membranes are moist.     Pharynx: Oropharynx is clear.  Cardiovascular:     Rate and Rhythm: Normal rate and regular rhythm.  Pulmonary:     Effort: Pulmonary effort is normal. No respiratory distress.     Breath sounds: Normal breath sounds.  Abdominal:     General: Abdomen is flat.     Palpations: Abdomen is soft.  Skin:    General: Skin is warm and dry.  Neurological:     General: No focal deficit present.     Mental Status: He is alert and oriented to person, place, and time. Mental status is at baseline.  Psychiatric:        Mood and Affect: Mood normal.        Behavior: Behavior normal.        Thought Content: Thought content normal.        Judgment: Judgment normal.      MD Evaluation Airway: WNL Heart: WNL Abdomen: WNL Chest/ Lungs: WNL ASA  Classification: 2 Mallampati/Airway Score: Three   Imaging: DG Foot Complete Left  Result Date: 08/01/2019 Please see detailed radiograph report in office note.  DG Foot Complete Right  Result Date: 08/01/2019 Please see detailed radiograph report in office note.   Labs:  CBC: Recent Labs    08/04/19 0630  WBC 5.5  HGB 15.4  HCT 44.1  PLT 173     COAGS: Recent Labs    08/04/19 0630  INR 1.0    BMP: No results for input(s): NA, K, CL, CO2, GLUCOSE, BUN, CALCIUM, CREATININE, GFRNONAA, GFRAA in the last 8760 hours.  Invalid input(s): CMP  LIVER FUNCTION TESTS: No results for input(s): BILITOT, AST, ALT, ALKPHOS, PROT, ALBUMIN in the last 8760 hours.  TUMOR MARKERS: No results for input(s): AFPTM, CEA, CA199, CHROMGRNA in the last 8760 hours.  Assessment and Plan: Patient with no past medical history presents with complaint of persistently elevated LFTs.  IR  consulted for random liver biopsy at the request of Dr. Levora Angel. Case reviewed by Dr. Fredia Sorrow who approves patient for procedure.  Patient presents today in their usual state of health.  He has been NPO and is not currently on blood thinners.    Risks and benefits was discussed with the patient and/or patient's family including, but not limited to bleeding, infection, damage to adjacent structures or low yield requiring additional tests.  All of the questions were answered and there is agreement to proceed.  Consent signed and in chart.  Thank you for this interesting consult.  I greatly enjoyed meeting BLANTON KARDELL and look forward to participating in their care.  A copy of this report was sent to the requesting provider on this date.  Electronically Signed: Hoyt Koch, PA 08/04/2019, 7:58 AM   I spent a total of  30 Minutes   in face to face in clinical consultation, greater than 50% of which was counseling/coordinating care for elevated LFTs.

## 2019-08-04 NOTE — Procedures (Signed)
Interventional Radiology Procedure Note  Procedure: US Guided Biopsy of Liver  Complications: None  Estimated Blood Loss: < 10 mL  Findings: 18 G core biopsy of liver performed under US guidance.  Three core samples obtained and sent to Pathology.  Jodi Marble. Fredia Sorrow, M.D Pager:  [phone]  I

## 2019-08-06 ENCOUNTER — Other Ambulatory Visit: Payer: Self-pay | Admitting: Physician Assistant

## 2019-08-10 LAB — SURGICAL PATHOLOGY

## 2019-08-16 ENCOUNTER — Telehealth: Payer: Self-pay | Admitting: *Deleted

## 2019-08-16 NOTE — Telephone Encounter (Signed)
Called and left a message stating that the insurance does not cover the gel and I stated that you can pick up the gel at the local pharmacy and I stated to call the office if any concerns. Misty Stanley

## 2019-09-19 ENCOUNTER — Ambulatory Visit: Payer: BC Managed Care – PPO | Admitting: Podiatry

## 2019-09-27 DIAGNOSIS — G4733 Obstructive sleep apnea (adult) (pediatric): Secondary | ICD-10-CM | POA: Diagnosis not present

## 2019-10-27 DIAGNOSIS — R7989 Other specified abnormal findings of blood chemistry: Secondary | ICD-10-CM | POA: Diagnosis not present

## 2019-11-07 DIAGNOSIS — M5136 Other intervertebral disc degeneration, lumbar region: Secondary | ICD-10-CM | POA: Diagnosis not present

## 2019-11-07 DIAGNOSIS — M4316 Spondylolisthesis, lumbar region: Secondary | ICD-10-CM | POA: Diagnosis not present

## 2019-11-07 DIAGNOSIS — M79671 Pain in right foot: Secondary | ICD-10-CM | POA: Diagnosis not present

## 2019-11-10 DIAGNOSIS — R7989 Other specified abnormal findings of blood chemistry: Secondary | ICD-10-CM | POA: Diagnosis not present

## 2019-11-22 DIAGNOSIS — G894 Chronic pain syndrome: Secondary | ICD-10-CM | POA: Diagnosis not present

## 2019-11-22 DIAGNOSIS — Z79891 Long term (current) use of opiate analgesic: Secondary | ICD-10-CM | POA: Diagnosis not present

## 2019-11-22 DIAGNOSIS — G4700 Insomnia, unspecified: Secondary | ICD-10-CM | POA: Diagnosis not present

## 2019-11-22 DIAGNOSIS — K5903 Drug induced constipation: Secondary | ICD-10-CM | POA: Diagnosis not present

## 2019-11-22 DIAGNOSIS — M47816 Spondylosis without myelopathy or radiculopathy, lumbar region: Secondary | ICD-10-CM | POA: Diagnosis not present

## 2019-12-05 DIAGNOSIS — R52 Pain, unspecified: Secondary | ICD-10-CM | POA: Diagnosis not present

## 2019-12-05 DIAGNOSIS — R110 Nausea: Secondary | ICD-10-CM | POA: Diagnosis not present

## 2019-12-05 DIAGNOSIS — Z20818 Contact with and (suspected) exposure to other bacterial communicable diseases: Secondary | ICD-10-CM | POA: Diagnosis not present

## 2019-12-08 DIAGNOSIS — M4316 Spondylolisthesis, lumbar region: Secondary | ICD-10-CM | POA: Diagnosis not present

## 2019-12-08 DIAGNOSIS — M5136 Other intervertebral disc degeneration, lumbar region: Secondary | ICD-10-CM | POA: Diagnosis not present

## 2019-12-18 DIAGNOSIS — R058 Other specified cough: Secondary | ICD-10-CM | POA: Diagnosis not present

## 2019-12-18 DIAGNOSIS — R0981 Nasal congestion: Secondary | ICD-10-CM | POA: Diagnosis not present

## 2019-12-25 DIAGNOSIS — K5903 Drug induced constipation: Secondary | ICD-10-CM | POA: Diagnosis not present

## 2019-12-25 DIAGNOSIS — M47816 Spondylosis without myelopathy or radiculopathy, lumbar region: Secondary | ICD-10-CM | POA: Diagnosis not present

## 2019-12-25 DIAGNOSIS — G4700 Insomnia, unspecified: Secondary | ICD-10-CM | POA: Diagnosis not present

## 2019-12-25 DIAGNOSIS — G894 Chronic pain syndrome: Secondary | ICD-10-CM | POA: Diagnosis not present

## 2019-12-28 DIAGNOSIS — Z20822 Contact with and (suspected) exposure to covid-19: Secondary | ICD-10-CM | POA: Diagnosis not present

## 2020-01-06 ENCOUNTER — Other Ambulatory Visit (HOSPITAL_COMMUNITY): Payer: Self-pay | Admitting: Oncology

## 2020-01-06 ENCOUNTER — Ambulatory Visit (HOSPITAL_COMMUNITY)
Admission: RE | Admit: 2020-01-06 | Discharge: 2020-01-06 | Disposition: A | Discharge status: Home / Self Care | Payer: BC Managed Care – PPO | Source: Ambulatory Visit | Attending: Pulmonary Disease | Admitting: Pulmonary Disease

## 2020-01-06 ENCOUNTER — Encounter (HOSPITAL_COMMUNITY): Payer: Self-pay | Admitting: Oncology

## 2020-01-06 ENCOUNTER — Telehealth (HOSPITAL_COMMUNITY): Payer: Self-pay | Admitting: Oncology

## 2020-01-06 DIAGNOSIS — U071 COVID-19: Secondary | ICD-10-CM | POA: Insufficient documentation

## 2020-01-06 DIAGNOSIS — Z6825 Body mass index (BMI) 25.0-25.9, adult: Secondary | ICD-10-CM | POA: Diagnosis not present

## 2020-01-06 MED ORDER — METHYLPREDNISOLONE SODIUM SUCC 125 MG IJ SOLR
INTRAMUSCULAR | Status: AC
  Administered 2020-01-06: 125 mg via INTRAVENOUS
  Filled 2020-01-06: qty 2

## 2020-01-06 MED ORDER — DIPHENHYDRAMINE HCL 50 MG/ML IJ SOLN: 50.0000 mg | Freq: Once | INTRAMUSCULAR | Status: DC | PRN

## 2020-01-06 MED ORDER — ALBUTEROL SULFATE HFA 108 (90 BASE) MCG/ACT IN AERS: 2.0000 | INHALATION_SPRAY | Freq: Once | RESPIRATORY_TRACT | Status: DC | PRN

## 2020-01-06 MED ORDER — SODIUM CHLORIDE 0.9 % IV SOLN: INTRAVENOUS | Status: DC | PRN

## 2020-01-06 MED ORDER — FAMOTIDINE IN NACL 20-0.9 MG/50ML-% IV SOLN
20.0000 mg | Freq: Once | INTRAVENOUS | Status: AC | PRN
  Administered 2020-01-06: 20 mg via INTRAVENOUS

## 2020-01-06 MED ORDER — SODIUM CHLORIDE 0.9 % IV SOLN: Freq: Once | INTRAVENOUS | Status: AC

## 2020-01-06 MED ORDER — METHYLPREDNISOLONE SODIUM SUCC 125 MG IJ SOLR: 125.0000 mg | Freq: Once | INTRAMUSCULAR | Status: AC | PRN

## 2020-01-06 MED ORDER — EPINEPHRINE 0.3 MG/0.3ML IJ SOAJ: 0.3000 mg | Freq: Once | INTRAMUSCULAR | Status: DC | PRN

## 2020-01-06 NOTE — Progress Notes (Signed)
  Diagnosis: COVID-19  Physician: Shan Levans, MD  Procedure: [facility] Med: bamlanivimab\etesevimab infusion - Provided patient with bamlanimivab\etesevimab fact sheet for patients, parents and caregivers prior to infusion.  Complications: Patient had reaction 5 mins into infusion (see provider notes). Infusion discontinued. Patient discahrged home stable, vital signs within normal limits. All questions answered.   Discharge: Discharged home   Angelia Mould 01/06/2020

## 2020-01-06 NOTE — Discharge Instructions (Signed)
10 Things You Can Do to Manage Your COVID-19 Symptoms at Home If you have possible or confirmed COVID-19: 1. Stay home from work and school. And stay away from other public places. If you must go out, avoid using any kind of public transportation, ridesharing, or taxis. 2. Monitor your symptoms carefully. If your symptoms get worse, call your healthcare provider immediately. 3. Get rest and stay hydrated. 4. If you have a medical appointment, call the healthcare provider ahead of time and tell them that you have or may have COVID-19. 5. For medical emergencies, call 911 and notify the dispatch personnel that you have or may have COVID-19. 6. Cover your cough and sneezes with a tissue or use the inside of your elbow. 7. Wash your hands often with soap and water for at least 20 seconds or clean your hands with an alcohol-based hand sanitizer that contains at least 60% alcohol. 8. As much as possible, stay in a specific room and away from other people in your home. Also, you should use a separate bathroom, if available. If you need to be around other people in or outside of the home, wear a mask. 9. Avoid sharing personal items with other people in your household, like dishes, towels, and bedding. 10. Clean all surfaces that are touched often, like counters, tabletops, and doorknobs. Use household cleaning sprays or wipes according to the label instructions. cdc.gov/coronavirus 09/07/2018 This information is not intended to replace advice given to you by your health care provider. Make sure you discuss any questions you have with your health care provider. Document Revised: 02/09/2019 Document Reviewed: 02/09/2019 Elsevier Patient Education  2020 Elsevier Inc.  

## 2020-01-06 NOTE — Progress Notes (Signed)
Orders placed for [facility].   Today at 3:30pm  Durenda Hurt, NP 01/06/2020 12:04 PM

## 2020-01-06 NOTE — Telephone Encounter (Signed)
I connected by phone with Wayne Forbes  to discuss the potential use of an new treatment for mild to moderate COVID-19 viral infection in non-hospitalized patients.   This patient is a age/sex that meets the FDA criteria for Emergency Use Authorization of casirivimab\imdevimab.  Has a (+) direct SARS-CoV-2 viral test result 1. Has mild or moderate COVID-19  2. Is ? 58 years of age and weighs ? 40 kg 3. Is NOT hospitalized due to COVID-19 4. Is NOT requiring oxygen therapy or requiring an increase in baseline oxygen flow rate due to COVID-19 5. Is within 10 days of symptom onset 6. Has at least one of the high risk factor(s) for progression to severe COVID-19 and/or hospitalization as defined in EUA. ? Specific high risk criteria : . Past Medical History:  Diagnosis Date  . Apnea, sleep    does not wear cpap all time  . Fatty liver 2019  . Kidney stone   . Kidney stones   ?  ?    Symptom onset  12/30/19   I have spoken and communicated the following to the patient or parent/caregiver:   1. FDA has authorized the emergency use of casirivimab\imdevimab for the treatment of mild to moderate COVID-19 in adults and pediatric patients with positive results of direct SARS-CoV-2 viral testing who are 19 years of age and older weighing at least 40 kg, and who are at high risk for progressing to severe COVID-19 and/or hospitalization.   2. The significant known and potential risks and benefits of casirivimab\imdevimab, and the extent to which such potential risks and benefits are unknown.   3. Information on available alternative treatments and the risks and benefits of those alternatives, including clinical trials.   4. Patients treated with casirivimab\imdevimab should continue to self-isolate and use infection control measures (e.g., wear mask, isolate, social distance, avoid sharing personal items, clean and disinfect "high touch" surfaces, and frequent handwashing) according to CDC  guidelines.    5. The patient or parent/caregiver has the option to accept or refuse casirivimab\imdevimab .   After reviewing this information with the patient, The patient agreed to proceed with receiving casirivimab\imdevimab infusion and will be provided a copy of the Fact sheet prior to receiving the infusion.Mignon Pine, AGNP-C [phone] (Infusion Center Hotline)

## 2020-01-06 NOTE — Progress Notes (Signed)
Wayne Forbes started to receive the bamlanivimab and etesevimab and within 5 minutes of starting the medication at a reduced dose began having flusheness and chest tightening. Medication was stopped immediately. Denies any shortness of breath, rashes or angioedema. Feeling better since medication was stopped. Will proceed with methylprednisolone and pepcid as a precaution. Will be monitored follow infusion.  Marcos Eke, NP 01/06/2020 4:16 PM

## 2020-02-12 DIAGNOSIS — R509 Fever, unspecified: Secondary | ICD-10-CM | POA: Diagnosis not present

## 2020-02-12 DIAGNOSIS — R197 Diarrhea, unspecified: Secondary | ICD-10-CM | POA: Diagnosis not present

## 2020-02-12 DIAGNOSIS — R52 Pain, unspecified: Secondary | ICD-10-CM | POA: Diagnosis not present

## 2020-02-12 DIAGNOSIS — R110 Nausea: Secondary | ICD-10-CM | POA: Diagnosis not present

## 2020-02-13 DIAGNOSIS — R52 Pain, unspecified: Secondary | ICD-10-CM | POA: Diagnosis not present

## 2020-02-13 DIAGNOSIS — R509 Fever, unspecified: Secondary | ICD-10-CM | POA: Diagnosis not present

## 2020-02-13 DIAGNOSIS — R197 Diarrhea, unspecified: Secondary | ICD-10-CM | POA: Diagnosis not present

## 2020-02-13 DIAGNOSIS — Z03818 Encounter for observation for suspected exposure to other biological agents ruled out: Secondary | ICD-10-CM | POA: Diagnosis not present

## 2020-02-19 DIAGNOSIS — M47816 Spondylosis without myelopathy or radiculopathy, lumbar region: Secondary | ICD-10-CM | POA: Diagnosis not present

## 2020-02-19 DIAGNOSIS — G894 Chronic pain syndrome: Secondary | ICD-10-CM | POA: Diagnosis not present

## 2020-02-19 DIAGNOSIS — K5903 Drug induced constipation: Secondary | ICD-10-CM | POA: Diagnosis not present

## 2020-02-19 DIAGNOSIS — G4700 Insomnia, unspecified: Secondary | ICD-10-CM | POA: Diagnosis not present

## 2020-03-18 DIAGNOSIS — M47816 Spondylosis without myelopathy or radiculopathy, lumbar region: Secondary | ICD-10-CM | POA: Diagnosis not present

## 2020-03-18 DIAGNOSIS — G4700 Insomnia, unspecified: Secondary | ICD-10-CM | POA: Diagnosis not present

## 2020-03-18 DIAGNOSIS — G894 Chronic pain syndrome: Secondary | ICD-10-CM | POA: Diagnosis not present

## 2020-03-18 DIAGNOSIS — K5903 Drug induced constipation: Secondary | ICD-10-CM | POA: Diagnosis not present

## 2020-04-01 DIAGNOSIS — R7989 Other specified abnormal findings of blood chemistry: Secondary | ICD-10-CM | POA: Diagnosis not present

## 2020-04-01 DIAGNOSIS — Z0000 Encounter for general adult medical examination without abnormal findings: Secondary | ICD-10-CM | POA: Diagnosis not present

## 2020-04-01 DIAGNOSIS — K7581 Nonalcoholic steatohepatitis (NASH): Secondary | ICD-10-CM | POA: Diagnosis not present

## 2020-04-01 DIAGNOSIS — G4733 Obstructive sleep apnea (adult) (pediatric): Secondary | ICD-10-CM | POA: Diagnosis not present

## 2020-04-01 DIAGNOSIS — M4316 Spondylolisthesis, lumbar region: Secondary | ICD-10-CM | POA: Diagnosis not present

## 2020-04-01 DIAGNOSIS — Z125 Encounter for screening for malignant neoplasm of prostate: Secondary | ICD-10-CM | POA: Diagnosis not present

## 2020-04-24 DIAGNOSIS — M79671 Pain in right foot: Secondary | ICD-10-CM | POA: Diagnosis not present

## 2020-05-01 DIAGNOSIS — M79671 Pain in right foot: Secondary | ICD-10-CM | POA: Diagnosis not present

## 2020-05-07 DIAGNOSIS — J45991 Cough variant asthma: Secondary | ICD-10-CM | POA: Diagnosis not present

## 2020-05-07 DIAGNOSIS — R0600 Dyspnea, unspecified: Secondary | ICD-10-CM | POA: Diagnosis not present

## 2020-05-07 DIAGNOSIS — R079 Chest pain, unspecified: Secondary | ICD-10-CM | POA: Diagnosis not present

## 2020-05-08 ENCOUNTER — Other Ambulatory Visit: Payer: Self-pay | Admitting: Internal Medicine

## 2020-05-08 ENCOUNTER — Ambulatory Visit
Admission: RE | Admit: 2020-05-08 | Discharge: 2020-05-08 | Disposition: A | Discharge status: Home / Self Care | Payer: BC Managed Care – PPO | Source: Ambulatory Visit | Attending: Internal Medicine | Admitting: Internal Medicine

## 2020-05-08 DIAGNOSIS — R0600 Dyspnea, unspecified: Secondary | ICD-10-CM

## 2020-05-08 DIAGNOSIS — R0602 Shortness of breath: Secondary | ICD-10-CM | POA: Diagnosis not present

## 2020-05-10 ENCOUNTER — Other Ambulatory Visit (HOSPITAL_COMMUNITY): Payer: Self-pay | Admitting: Internal Medicine

## 2020-05-10 DIAGNOSIS — R0609 Other forms of dyspnea: Secondary | ICD-10-CM

## 2020-05-10 DIAGNOSIS — R0600 Dyspnea, unspecified: Secondary | ICD-10-CM

## 2020-05-13 DIAGNOSIS — G894 Chronic pain syndrome: Secondary | ICD-10-CM | POA: Diagnosis not present

## 2020-05-13 DIAGNOSIS — M47816 Spondylosis without myelopathy or radiculopathy, lumbar region: Secondary | ICD-10-CM | POA: Diagnosis not present

## 2020-05-13 DIAGNOSIS — K5903 Drug induced constipation: Secondary | ICD-10-CM | POA: Diagnosis not present

## 2020-05-13 DIAGNOSIS — G4700 Insomnia, unspecified: Secondary | ICD-10-CM | POA: Diagnosis not present

## 2020-05-23 ENCOUNTER — Telehealth (HOSPITAL_COMMUNITY): Payer: Self-pay | Admitting: *Deleted

## 2020-05-23 NOTE — Telephone Encounter (Signed)
Patient given detailed instructions per Myocardial Perfusion Study Information Sheet for the test on 05/27/20 at 8:00. Patient notified to arrive 15 minutes early and that it is imperative to arrive on time for appointment to keep from having the test rescheduled.  If you need to cancel or reschedule your appointment, please call the office within 24 hours of your appointment. . Patient verbalized understanding.Daneil Dolin

## 2020-05-24 ENCOUNTER — Other Ambulatory Visit (HOSPITAL_COMMUNITY)
Admission: RE | Admit: 2020-05-24 | Discharge: 2020-05-24 | Disposition: A | Discharge status: Home / Self Care | Payer: BC Managed Care – PPO | Source: Ambulatory Visit | Attending: Internal Medicine | Admitting: Internal Medicine

## 2020-05-24 DIAGNOSIS — Z01812 Encounter for preprocedural laboratory examination: Secondary | ICD-10-CM | POA: Insufficient documentation

## 2020-05-24 DIAGNOSIS — Z20822 Contact with and (suspected) exposure to covid-19: Secondary | ICD-10-CM | POA: Diagnosis not present

## 2020-05-25 LAB — SARS CORONAVIRUS 2 (TAT 6-24 HRS): SARS Coronavirus 2: NEGATIVE

## 2020-05-27 ENCOUNTER — Ambulatory Visit (HOSPITAL_COMMUNITY): Payer: BC Managed Care – PPO | Attending: Cardiology

## 2020-05-27 ENCOUNTER — Other Ambulatory Visit: Payer: Self-pay

## 2020-05-27 DIAGNOSIS — R0600 Dyspnea, unspecified: Secondary | ICD-10-CM

## 2020-05-27 DIAGNOSIS — R0609 Other forms of dyspnea: Secondary | ICD-10-CM

## 2020-05-27 LAB — MYOCARDIAL PERFUSION IMAGING
Estimated workload: 7.7 METS
Exercise duration (min): 6 min
Exercise duration (sec): 31 s
LV dias vol: 99 mL (ref 62–150)
LV sys vol: 51 mL
MPHR: 161 {beats}/min
Peak HR: 150 {beats}/min
Percent HR: 93 %
RPE: 18
Rest HR: 73 {beats}/min
SDS: 5
SRS: 0
SSS: 5
TID: 0.95

## 2020-05-27 MED ORDER — TECHNETIUM TC 99M TETROFOSMIN IV KIT
31.2000 | PACK | Freq: Once | INTRAVENOUS | Status: AC | PRN
  Administered 2020-05-27: 31.2 via INTRAVENOUS
  Filled 2020-05-27: qty 32

## 2020-05-27 MED ORDER — TECHNETIUM TC 99M TETROFOSMIN IV KIT
10.1000 | PACK | Freq: Once | INTRAVENOUS | Status: AC | PRN
  Administered 2020-05-27: 10.1 via INTRAVENOUS
  Filled 2020-05-27: qty 11

## 2020-06-07 ENCOUNTER — Ambulatory Visit (INDEPENDENT_AMBULATORY_CARE_PROVIDER_SITE_OTHER): Payer: BC Managed Care – PPO | Admitting: Podiatry

## 2020-06-07 ENCOUNTER — Other Ambulatory Visit: Payer: Self-pay

## 2020-06-07 DIAGNOSIS — M7671 Peroneal tendinitis, right leg: Secondary | ICD-10-CM | POA: Diagnosis not present

## 2020-06-12 DIAGNOSIS — R7989 Other specified abnormal findings of blood chemistry: Secondary | ICD-10-CM | POA: Diagnosis not present

## 2020-06-13 ENCOUNTER — Telehealth: Payer: Self-pay | Admitting: Podiatry

## 2020-06-13 NOTE — Telephone Encounter (Signed)
He has to wait at least 2 weeks. Please advise him to ice, rest the area until his next appt.

## 2020-06-13 NOTE — Telephone Encounter (Signed)
Patient called and stated that his right foot pain is back hurting and he wanted to know if he can get another injection this week

## 2020-06-14 ENCOUNTER — Telehealth: Payer: Self-pay | Admitting: Podiatry

## 2020-06-14 NOTE — Telephone Encounter (Signed)
Patient called inquiring about another injection due to pain, Per Price patient will have to wait until next appointment for the injection

## 2020-06-14 NOTE — Telephone Encounter (Signed)
Spoke to patient

## 2020-06-18 DIAGNOSIS — K7581 Nonalcoholic steatohepatitis (NASH): Secondary | ICD-10-CM | POA: Diagnosis not present

## 2020-06-18 DIAGNOSIS — K7401 Hepatic fibrosis, early fibrosis: Secondary | ICD-10-CM | POA: Diagnosis not present

## 2020-06-18 DIAGNOSIS — R7989 Other specified abnormal findings of blood chemistry: Secondary | ICD-10-CM | POA: Diagnosis not present

## 2020-07-09 ENCOUNTER — Ambulatory Visit (INDEPENDENT_AMBULATORY_CARE_PROVIDER_SITE_OTHER): Payer: BC Managed Care – PPO | Admitting: Podiatry

## 2020-07-09 ENCOUNTER — Other Ambulatory Visit: Payer: Self-pay

## 2020-07-09 DIAGNOSIS — H18519 Endothelial corneal dystrophy, unspecified eye: Secondary | ICD-10-CM | POA: Insufficient documentation

## 2020-07-09 DIAGNOSIS — M7671 Peroneal tendinitis, right leg: Secondary | ICD-10-CM | POA: Diagnosis not present

## 2020-07-09 DIAGNOSIS — M5136 Other intervertebral disc degeneration, lumbar region: Secondary | ICD-10-CM | POA: Insufficient documentation

## 2020-07-09 DIAGNOSIS — R899 Unspecified abnormal finding in specimens from other organs, systems and tissues: Secondary | ICD-10-CM | POA: Insufficient documentation

## 2020-07-09 DIAGNOSIS — J45991 Cough variant asthma: Secondary | ICD-10-CM | POA: Insufficient documentation

## 2020-07-09 DIAGNOSIS — K760 Fatty (change of) liver, not elsewhere classified: Secondary | ICD-10-CM | POA: Insufficient documentation

## 2020-07-09 DIAGNOSIS — J329 Chronic sinusitis, unspecified: Secondary | ICD-10-CM | POA: Insufficient documentation

## 2020-07-09 DIAGNOSIS — K5900 Constipation, unspecified: Secondary | ICD-10-CM | POA: Insufficient documentation

## 2020-07-09 DIAGNOSIS — F5101 Primary insomnia: Secondary | ICD-10-CM | POA: Insufficient documentation

## 2020-07-09 DIAGNOSIS — M4310 Spondylolisthesis, site unspecified: Secondary | ICD-10-CM | POA: Insufficient documentation

## 2020-07-09 DIAGNOSIS — F1010 Alcohol abuse, uncomplicated: Secondary | ICD-10-CM | POA: Insufficient documentation

## 2020-07-09 DIAGNOSIS — R7989 Other specified abnormal findings of blood chemistry: Secondary | ICD-10-CM | POA: Insufficient documentation

## 2020-07-09 DIAGNOSIS — I519 Heart disease, unspecified: Secondary | ICD-10-CM | POA: Insufficient documentation

## 2020-07-09 MED ORDER — BETAMETHASONE SOD PHOS & ACET 6 (3-3) MG/ML IJ SUSP: 3.0000 mg | Freq: Once | INTRAMUSCULAR | Status: AC

## 2020-07-09 NOTE — Progress Notes (Signed)
  Subjective:  Patient ID: Wayne Forbes, male    DOB: 27-Nov-1960,  MRN: [id number]  Chief Complaint  Patient presents with  . Follow-up    Reports some improvement in rt foot w/ icing/soaks/stretches. Desires another injection.     60 y.o. male presents with the above complaint. History confirmed with patient.   Objective:  Physical Exam: warm, good capillary refill, no trophic changes or ulcerative lesions, normal DP and PT pulses and normal sensory exam.  Right Foot: TTP right 5th met base at PB insertion  Assessment:   1. Peroneal tendonitis of right lower leg    Plan:  Patient was evaluated and treated and all questions answered.  Peroneal tendonitis right -Injection delivered to the painful joint  Procedure: Tendon Injection Location: Right 5th met base PB insertion Skin Prep: Alcohol. Injectate: 0.5 cc 1% lidocaine plain, 0.5 cc betamethasone acetate-betamethasone sodium phosphate Disposition: Patient tolerated procedure well. Injection site dressed with a band-aid.   No follow-ups on file.

## 2020-07-10 DIAGNOSIS — M47817 Spondylosis without myelopathy or radiculopathy, lumbosacral region: Secondary | ICD-10-CM | POA: Diagnosis not present

## 2020-07-10 DIAGNOSIS — M961 Postlaminectomy syndrome, not elsewhere classified: Secondary | ICD-10-CM | POA: Diagnosis not present

## 2020-07-10 DIAGNOSIS — G894 Chronic pain syndrome: Secondary | ICD-10-CM | POA: Diagnosis not present

## 2020-07-10 DIAGNOSIS — K5903 Drug induced constipation: Secondary | ICD-10-CM | POA: Diagnosis not present

## 2020-07-10 DIAGNOSIS — M47812 Spondylosis without myelopathy or radiculopathy, cervical region: Secondary | ICD-10-CM | POA: Diagnosis not present

## 2020-07-10 DIAGNOSIS — G4700 Insomnia, unspecified: Secondary | ICD-10-CM | POA: Diagnosis not present

## 2020-07-10 DIAGNOSIS — M47816 Spondylosis without myelopathy or radiculopathy, lumbar region: Secondary | ICD-10-CM | POA: Diagnosis not present

## 2020-07-11 DIAGNOSIS — R7989 Other specified abnormal findings of blood chemistry: Secondary | ICD-10-CM | POA: Diagnosis not present

## 2020-07-11 DIAGNOSIS — K760 Fatty (change of) liver, not elsewhere classified: Secondary | ICD-10-CM | POA: Diagnosis not present

## 2020-07-11 NOTE — Progress Notes (Signed)
  Subjective:  Patient ID: Wayne Forbes, male    DOB: [birth year],  MRN: [id number]  No chief complaint on file.   60 y.o. male presents for follow-up of right foot pain.  States that the pain is back and he would like another injection.  Objective:  Physical Exam: warm, good capillary refill, no trophic changes or ulcerative lesions, normal DP and PT pulses and normal sensory exam. Right Foot: Pain to palpation patient about the right fifth metatarsal base no pain along the proximal peroneals at the malleoli.  No local edema no pain about the fifth metatarsal shaft Assessment:   1. Peroneal tendonitis of right lower extremity    Plan:  Patient was evaluated and treated and all questions answered.  Enthesitis R -Educated on etiology -Injection delivered below -Dispensed Tri-Lock ankle brace  Procedure: Tendon injection Location: Right peroneus brevis insertion Skin Prep: Alcohol. Injectate: 0.5 cc 0.5 cc marcaine plain, 0.5 betamethasone acetate-betamethasone sodium phosphate Disposition: Patient tolerated procedure well. Injection site dressed with a band-aid.   No follow-ups on file.

## 2020-07-15 ENCOUNTER — Ambulatory Visit: Payer: BC Managed Care – PPO | Admitting: Cardiology

## 2020-07-29 DIAGNOSIS — R7989 Other specified abnormal findings of blood chemistry: Secondary | ICD-10-CM | POA: Diagnosis not present

## 2020-08-02 ENCOUNTER — Other Ambulatory Visit: Payer: Self-pay

## 2020-08-02 ENCOUNTER — Ambulatory Visit (INDEPENDENT_AMBULATORY_CARE_PROVIDER_SITE_OTHER): Payer: BC Managed Care – PPO | Admitting: Podiatry

## 2020-08-02 DIAGNOSIS — M779 Enthesopathy, unspecified: Secondary | ICD-10-CM | POA: Diagnosis not present

## 2020-08-02 DIAGNOSIS — M7671 Peroneal tendinitis, right leg: Secondary | ICD-10-CM | POA: Diagnosis not present

## 2020-08-02 MED ORDER — METHYLPREDNISOLONE 4 MG PO TBPK: ORAL_TABLET | ORAL | 0 refills | Status: DC

## 2020-08-02 NOTE — Progress Notes (Signed)
  Subjective:  Patient ID: Wayne Forbes, male    DOB: 10/08/1960,  MRN: [id number]  Chief Complaint  Patient presents with  . Follow-up    Minimal relief w/ last injection. Some days are worse than others. 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    60 y.o. male presents with the above complaint. History confirmed with patient.   Objective:  Physical Exam: warm, good capillary refill, no trophic changes or ulcerative lesions, normal DP and PT pulses and normal sensory exam.  Right Foot: TTP right 5th met base at PB insertion  Assessment:   1. Peroneal tendonitis of right lower leg   2. Tendonitis    Plan:  Patient was evaluated and treated and all questions answered.  Peroneal tendonitis right -No further injection today -Immobilize in CAM boot -Rx Medrol pack for reduction of inflammation -Pt has next week off hopefully this will let it rest -MRI if issues persist.   No follow-ups on file.

## 2020-08-07 DIAGNOSIS — R400 Somnolence: Secondary | ICD-10-CM | POA: Diagnosis not present

## 2020-09-02 ENCOUNTER — Encounter: Payer: Self-pay | Admitting: Podiatry

## 2020-09-02 ENCOUNTER — Telehealth: Payer: Self-pay | Admitting: Podiatry

## 2020-09-02 NOTE — Telephone Encounter (Signed)
Patient is requesting an out of work note (and short term disability) due to foot pain, until he can be seen again. Will it be ok to go ahead and type up a note for him? Patient will pick up note when ready.

## 2020-09-02 NOTE — Telephone Encounter (Signed)
Yes I'm fine with that 

## 2020-09-03 ENCOUNTER — Encounter: Payer: Self-pay | Admitting: Podiatry

## 2020-09-06 ENCOUNTER — Ambulatory Visit (INDEPENDENT_AMBULATORY_CARE_PROVIDER_SITE_OTHER): Payer: BC Managed Care – PPO | Admitting: Podiatry

## 2020-09-06 ENCOUNTER — Encounter: Payer: Self-pay | Admitting: Podiatry

## 2020-09-06 ENCOUNTER — Other Ambulatory Visit: Payer: Self-pay

## 2020-09-06 DIAGNOSIS — M779 Enthesopathy, unspecified: Secondary | ICD-10-CM | POA: Diagnosis not present

## 2020-09-06 DIAGNOSIS — M7671 Peroneal tendinitis, right leg: Secondary | ICD-10-CM | POA: Diagnosis not present

## 2020-09-06 NOTE — Progress Notes (Signed)
  Subjective:  Patient ID: Wayne Forbes, male    DOB: April 21, 1960,  MRN: [id number]  Chief Complaint  Patient presents with   Tendonitis    Right foot still painful. Medication has not helped.   60 y.o. male presents with the above complaint. History confirmed with patient.   Objective:  Physical Exam: warm, good capillary refill, no trophic changes or ulcerative lesions, normal DP and PT pulses and normal sensory exam.  Right Foot: TTP right 5th met base at PB insertion  Assessment:   1. Peroneal tendonitis of right lower leg   2. Tendonitis     Plan:  Patient was evaluated and treated and all questions answered.  Peroneal tendonitis right -Order MRI for recalcitrant tendonitis. Has failed bracing, rest, oral steroids, steroid injection. Symptoms for > 3 months as of today.   No follow-ups on file.

## 2020-09-10 DIAGNOSIS — R7989 Other specified abnormal findings of blood chemistry: Secondary | ICD-10-CM | POA: Diagnosis not present

## 2020-09-13 DIAGNOSIS — K7581 Nonalcoholic steatohepatitis (NASH): Secondary | ICD-10-CM | POA: Diagnosis not present

## 2020-09-13 DIAGNOSIS — R7989 Other specified abnormal findings of blood chemistry: Secondary | ICD-10-CM | POA: Diagnosis not present

## 2020-09-13 DIAGNOSIS — K7460 Unspecified cirrhosis of liver: Secondary | ICD-10-CM | POA: Diagnosis not present

## 2020-09-13 DIAGNOSIS — Z79899 Other long term (current) drug therapy: Secondary | ICD-10-CM | POA: Diagnosis not present

## 2020-09-13 DIAGNOSIS — R7401 Elevation of levels of liver transaminase levels: Secondary | ICD-10-CM | POA: Diagnosis not present

## 2020-09-16 ENCOUNTER — Ambulatory Visit: Payer: BC Managed Care – PPO | Admitting: Cardiology

## 2020-09-16 ENCOUNTER — Other Ambulatory Visit: Payer: Self-pay

## 2020-09-17 ENCOUNTER — Encounter: Payer: Self-pay | Admitting: Podiatry

## 2020-09-20 ENCOUNTER — Telehealth: Payer: Self-pay | Admitting: *Deleted

## 2020-09-20 NOTE — Telephone Encounter (Signed)
Patient is calling and asking for other recommendations, was given boot at visit and foot is still hurting with walking in the boot. Please advise.

## 2020-09-20 NOTE — Telephone Encounter (Signed)
Please schedule

## 2020-09-20 NOTE — Telephone Encounter (Signed)
He is scheduled for MRI tomorrow can we make him an appt next week so we can review it and go over next steps?

## 2020-09-21 ENCOUNTER — Other Ambulatory Visit: Payer: Self-pay

## 2020-09-21 ENCOUNTER — Other Ambulatory Visit: Payer: BC Managed Care – PPO

## 2020-09-21 ENCOUNTER — Ambulatory Visit
Admission: RE | Admit: 2020-09-21 | Discharge: 2020-09-21 | Disposition: A | Discharge status: Home / Self Care | Payer: BC Managed Care – PPO | Source: Ambulatory Visit | Attending: Podiatry | Admitting: Podiatry

## 2020-09-21 DIAGNOSIS — M7671 Peroneal tendinitis, right leg: Secondary | ICD-10-CM

## 2020-09-21 DIAGNOSIS — M25571 Pain in right ankle and joints of right foot: Secondary | ICD-10-CM | POA: Diagnosis not present

## 2020-09-21 DIAGNOSIS — R600 Localized edema: Secondary | ICD-10-CM | POA: Diagnosis not present

## 2020-09-25 DIAGNOSIS — M47816 Spondylosis without myelopathy or radiculopathy, lumbar region: Secondary | ICD-10-CM | POA: Diagnosis not present

## 2020-09-25 DIAGNOSIS — G4700 Insomnia, unspecified: Secondary | ICD-10-CM | POA: Diagnosis not present

## 2020-09-25 DIAGNOSIS — K5903 Drug induced constipation: Secondary | ICD-10-CM | POA: Diagnosis not present

## 2020-09-25 DIAGNOSIS — G894 Chronic pain syndrome: Secondary | ICD-10-CM | POA: Diagnosis not present

## 2020-09-25 DIAGNOSIS — R7989 Other specified abnormal findings of blood chemistry: Secondary | ICD-10-CM | POA: Diagnosis not present

## 2020-09-26 ENCOUNTER — Ambulatory Visit (INDEPENDENT_AMBULATORY_CARE_PROVIDER_SITE_OTHER): Payer: BC Managed Care – PPO | Admitting: Cardiology

## 2020-09-26 ENCOUNTER — Encounter: Payer: Self-pay | Admitting: Cardiology

## 2020-09-26 ENCOUNTER — Other Ambulatory Visit: Payer: Self-pay

## 2020-09-26 VITALS — BP 122/90 | HR 73 | Ht 68.0 in | Wt 219.2 lb

## 2020-09-26 DIAGNOSIS — I420 Dilated cardiomyopathy: Secondary | ICD-10-CM | POA: Diagnosis not present

## 2020-09-26 DIAGNOSIS — R0600 Dyspnea, unspecified: Secondary | ICD-10-CM | POA: Diagnosis not present

## 2020-09-26 DIAGNOSIS — R0609 Other forms of dyspnea: Secondary | ICD-10-CM

## 2020-09-26 MED ORDER — METOPROLOL TARTRATE 100 MG PO TABS: 100.0000 mg | ORAL_TABLET | Freq: Once | ORAL | 0 refills | Status: AC

## 2020-09-26 NOTE — Patient Instructions (Addendum)
Medication Instructions:  Your physician recommends that you continue on your current medications as directed. Please refer to the Current Medication list given to you today.  *If you need a refill on your cardiac medications before your next appointment, please call your pharmacy*   Lab Work: TODAY: BMET If you have labs (blood work) drawn today and your tests are completely normal, you will receive your results only by: MyChart Message (if you have MyChart) OR A paper copy in the mail If you have any lab test that is abnormal or we need to change your treatment, we will call you to review the results.   Testing/Procedures: Your provider has recommended that you have a coronary CT angiogram. Please see below for further instructions.    Follow-Up: At [facility], you and your health needs are our priority.  As part of our continuing mission to provide you with exceptional heart care, we have created designated Provider Care Teams.  These Care Teams include your primary Cardiologist (physician) and Advanced Practice Providers (APPs -  Physician Assistants and Nurse Practitioners) who all work together to provide you with the care you need, when you need it.  Follow up with Dr. Mayford Knife as needed based on results of testing.    Other Instructions   Your cardiac CT will be scheduled at:  [facility] [address] [place], [place] [address] [phone]  Please arrive at the [facility] main entrance (entrance A) of [facility] 30 minutes prior to test start time. Proceed to the [facility] Radiology Department (first floor) to check-in and test prep.  Please follow these instructions carefully (unless otherwise directed):  Hold all erectile dysfunction medications at least 3 days (72 hrs) prior to test.  On the Night Before the Test: Be sure to Drink plenty of water. Do not consume any caffeinated/decaffeinated beverages or chocolate 12 hours prior to  your test. Do not take any antihistamines 12 hours prior to your test.  On the Day of the Test: Drink plenty of water until 1 hour prior to the test. Do not eat any food 4 hours prior to the test. You may take your regular medications prior to the test.  Take metoprolol (Lopressor) two hours prior to test.      After the Test: Drink plenty of water. After receiving IV contrast, you may experience a mild flushed feeling. This is normal. On occasion, you may experience a mild rash up to 24 hours after the test. This is not dangerous. If this occurs, you can take Benadryl 25 mg and increase your fluid intake. If you experience trouble breathing, this can be serious. If it is severe call 911 IMMEDIATELY. If it is mild, please call our office. If you take any of these medications: Glipizide/Metformin, Avandament, Glucavance, please do not take 48 hours after completing test unless otherwise instructed.  Please allow 2-4 weeks for scheduling of routine cardiac CTs. Some insurance companies require a pre-authorization which may delay scheduling of this test.   For non-scheduling related questions, please contact the cardiac imaging nurse navigator should you have any questions/concerns: Rockwell Alexandria, Cardiac Imaging Nurse Navigator Larey Brick, Cardiac Imaging Nurse Navigator [facility] Direct Office Dial: [phone]   For scheduling needs, including cancellations and rescheduling, please call [place], [phone].

## 2020-09-26 NOTE — Addendum Note (Signed)
Addended by: Theresia Majors on: 09/26/2020 02:17 PM   Modules accepted: Orders

## 2020-09-26 NOTE — Progress Notes (Signed)
Cardiology CONSULT Note    Date:  09/26/2020   ID:  Wayne Forbes, DOB [birth year], MRN [id number]  PCP:  Kirby Funk, MD  Cardiologist:  Armanda Magic, MD   Chief Complaint  Patient presents with   Shortness of Breath   Cardiomyopathy     History of Present Illness:  Wayne Forbes is a 60 y.o. male who is being seen today for the evaluation of DOE and abnormal stress test  at the request of Kirby Funk, MD.  This is a 60yo male with  no prior cardiac hx who had been having DOE for about a year.  He apparently had COVID months ago but right before that he had Influenza but he does not think that his SOB worsened after that.  He also was having sharp pains in his left chest with radiation into his left arm. He continues to have these sporadically.  A 2D echo was done showing LV dysfunction who recently had a Stress myoview done which showed LV dysfunction with EF 48% and no ischemia.  He is now referred for evaluation of possible NICM.  He denies any dizziness, palpitations, PND, orthopnea , dsyncope or LE edema.   Past Medical History:  Diagnosis Date   Alcohol use    Apnea, sleep    does not wear cpap all time   Calculus of kidney    Constipation    Cough    COVID-19    DDD (degenerative disc disease), lumbar    Elevated ferritin    Elevated liver function tests    Endothelial corneal dystrophy, unspecified eye    Fatty liver 2019   Fuchs' corneal dystrophy    High cholesterol    Insomnia    Kidney stone    Kidney stones    LV dysfunction    OSA (obstructive sleep apnea)    Shingles    Sinusitis    Spondylolisthesis of lumbar region    Steatohepatitis     Past Surgical History:  Procedure Laterality Date   EXTRACORPOREAL SHOCK WAVE LITHOTRIPSY Right 05/26/2018   Procedure: EXTRACORPOREAL SHOCK WAVE LITHOTRIPSY (ESWL);  Surgeon: [name], MD;  Location: [facility];  Service: Urology;  Laterality: Right;   EXTRACORPOREAL SHOCK WAVE  LITHOTRIPSY Right 05/30/2018   Procedure: EXTRACORPOREAL SHOCK WAVE LITHOTRIPSY (ESWL);  Surgeon: [name], MD;  Location: [facility];  Service: Urology;  Laterality: Right;   TENDON REPAIR Right    arm    Current Medications: Current Meds  Medication Sig   albuterol (ACCUNEB) 0.63 MG/3ML nebulizer solution Inhale into the lungs.   budesonide-formoterol (SYMBICORT) 160-4.5 MCG/ACT inhaler Inhale 1 puff into the lungs 2 (two) times daily.   cyclobenzaprine (FLEXERIL) 10 MG tablet 1 tablet   escitalopram (LEXAPRO) 10 MG tablet Take 1 tablet by mouth daily.   FLUoxetine (PROZAC) 20 MG capsule Take 20 mg by mouth daily.   fluticasone (FLONASE) 50 MCG/ACT nasal spray 1 spray in each nostril   Melatonin 10 MG TABS Take by mouth.   Methylnaltrexone Bromide (RELISTOR PO) Take by mouth.   Probiotic CAPS Take 1 capsule by mouth daily.   valACYclovir (VALTREX) 500 MG tablet Take 1,000 mg by mouth daily as needed (fever blisters).   [DISCONTINUED] RELISTOR 150 MG TABS Take 3 tablets by mouth daily.   Current Facility-Administered Medications for the 09/26/20 encounter (Office Visit) with Quintella Reichert, MD  Medication   betamethasone acetate-betamethasone sodium phosphate (CELESTONE) injection 3 mg  Allergies:   Patient has no known allergies.   Social History   Socioeconomic History   Marital status: Married    Spouse name: Not on file   Number of children: Not on file   Years of education: Not on file   Highest education level: Not on file  Occupational History   Not on file  Tobacco Use   Smoking status: Never   Smokeless tobacco: Never  Vaping Use   Vaping Use: Never used  Substance and Sexual Activity   Alcohol use: Yes    Comment: weekend -socially beer   Drug use: No   Sexual activity: Not on file  Other Topics Concern   Not on file  Social History Narrative   Not on file   Social Determinants of Health   Financial Resource Strain: Not on file  Food Insecurity:  Not on file  Transportation Needs: Not on file  Physical Activity: Not on file  Stress: Not on file  Social Connections: Not on file     Family History:  The patient's family history includes Congestive Heart Failure in his mother; Heart disease in his father; Kidney failure in his father.   ROS:   Please see the history of present illness.    ROS All other systems reviewed and are negative.  No flowsheet data found.   PHYSICAL EXAM:   VS:  BP 122/90   Pulse 73   Ht 5\' 8"  (1.727 m)   Wt 219 lb 3.2 oz (99.4 kg)   SpO2 95%   BMI 33.33 kg/m    GEN: Well nourished, well developed, in no acute distress  HEENT: normal  Neck: no JVD, carotid bruits, or masses Cardiac: RRR; no murmurs, rubs, or gallops,no edema.  Intact distal pulses bilaterally.  Respiratory:  clear to auscultation bilaterally, normal work of breathing GI: soft, nontender, nondistended, + BS MS: no deformity or atrophy  Skin: warm and dry, no rash Neuro:  Alert and Oriented x 3, Strength and sensation are intact Psych: euthymic mood, full affect  Wt Readings from Last 3 Encounters:  09/26/20 219 lb 3.2 oz (99.4 kg)  05/27/20 216 lb (98 kg)  08/04/19 205 lb (93 kg)      Studies/Labs Reviewed:   EKG:  EKG is ordered today.  The ekg ordered today demonstrates NSR at 73bpm with no ST changes  Recent Labs: No results found for requested labs within last 8760 hours.   Lipid Panel No results found for: CHOL, TRIG, HDL, CHOLHDL, VLDL, LDLCALC, LDLDIRECT  Additional studies/ records that were reviewed today include:  Stress myoview    ASSESSMENT:    1. DOE (dyspnea on exertion)   2. DCM (dilated cardiomyopathy) (HCC)      PLAN:  In order of problems listed above:  DOE -unclear etiology -stress myoview showed mildly reduced LVF with EF 48% and no ischemia although he could have balanced ischemia -He had Influenza and COVID 19 months ago which could have resulted in DCM -he has never smoked but  his mother had CHF and his father had CAD with multiple MIs -I have recommended a coronary CTA to define coronary anatomy -I will get a copy of 2D echo from PCP  2.  DCM -likely nonischemic given normal perfusion on myoview -see #2  Time Spent: 20 minutes total time of encounter, including 15 minutes spent in face-to-face patient care on the date of this encounter. This time includes coordination of care and counseling regarding above mentioned problem  list. Remainder of non-face-to-face time involved reviewing chart documents/testing relevant to the patient encounter and documentation in the medical record. I have independently reviewed documentation from referring provider  Medication Adjustments/Labs and Tests Ordered: Current medicines are reviewed at length with the patient today.  Concerns regarding medicines are outlined above.  Medication changes, Labs and Tests ordered today are listed in the Patient Instructions below.  There are no Patient Instructions on file for this visit.   Signed, Armanda Magic, MD  09/26/2020 2:03 PM    [facility] [address] [place], [place], [place]  [address] Phone: [phone]; Fax: [phone]

## 2020-09-27 LAB — BASIC METABOLIC PANEL
BUN/Creatinine Ratio: 11 (ref 9–20)
BUN: 11 mg/dL (ref 6–24)
CO2: 24 mmol/L (ref 20–29)
Calcium: 9.7 mg/dL (ref 8.7–10.2)
Chloride: 100 mmol/L (ref 96–106)
Creatinine, Ser: 1.03 mg/dL (ref 0.76–1.27)
Glucose: 99 mg/dL (ref 65–99)
Potassium: 4.4 mmol/L (ref 3.5–5.2)
Sodium: 140 mmol/L (ref 134–144)
eGFR: 84 mL/min/{1.73_m2} (ref >59)

## 2020-09-30 ENCOUNTER — Telehealth (HOSPITAL_COMMUNITY): Payer: Self-pay | Admitting: *Deleted

## 2020-09-30 NOTE — Telephone Encounter (Signed)
Reaching out to patient to offer assistance regarding upcoming cardiac imaging study; pt verbalizes understanding of appt date/time, parking situation and where to check in, pre-test NPO status and medications ordered, and verified current allergies; name and call back number provided for further questions should they arise  Taahir Grisby RN Navigator Cardiac Imaging  Heart and Vascular [phone] office [phone] cell  Patient to take 100mg metoprolol tartrate 2 hours prior to cardiac CT scan. 

## 2020-10-01 ENCOUNTER — Ambulatory Visit (INDEPENDENT_AMBULATORY_CARE_PROVIDER_SITE_OTHER): Payer: BC Managed Care – PPO | Admitting: Podiatry

## 2020-10-01 ENCOUNTER — Other Ambulatory Visit: Payer: Self-pay

## 2020-10-01 DIAGNOSIS — M779 Enthesopathy, unspecified: Secondary | ICD-10-CM

## 2020-10-01 DIAGNOSIS — M7671 Peroneal tendinitis, right leg: Secondary | ICD-10-CM

## 2020-10-02 ENCOUNTER — Encounter (HOSPITAL_COMMUNITY): Payer: Self-pay

## 2020-10-02 ENCOUNTER — Ambulatory Visit (HOSPITAL_COMMUNITY)
Admission: RE | Admit: 2020-10-02 | Discharge: 2020-10-02 | Disposition: A | Discharge status: Home / Self Care | Payer: BC Managed Care – PPO | Source: Ambulatory Visit | Attending: Cardiology | Admitting: Cardiology

## 2020-10-02 DIAGNOSIS — R0600 Dyspnea, unspecified: Secondary | ICD-10-CM | POA: Insufficient documentation

## 2020-10-02 DIAGNOSIS — I420 Dilated cardiomyopathy: Secondary | ICD-10-CM | POA: Insufficient documentation

## 2020-10-02 DIAGNOSIS — R0609 Other forms of dyspnea: Secondary | ICD-10-CM

## 2020-10-02 MED ORDER — IOHEXOL 350 MG/ML SOLN
95.0000 mL | Freq: Once | INTRAVENOUS | Status: AC | PRN
  Administered 2020-10-02: 95 mL via INTRAVENOUS

## 2020-10-02 MED ORDER — NITROGLYCERIN 0.4 MG SL SUBL
SUBLINGUAL_TABLET | SUBLINGUAL | Status: AC
  Filled 2020-10-02: qty 2

## 2020-10-02 MED ORDER — NITROGLYCERIN 0.4 MG SL SUBL
0.8000 mg | SUBLINGUAL_TABLET | Freq: Once | SUBLINGUAL | Status: AC
  Administered 2020-10-02: 0.8 mg via SUBLINGUAL

## 2020-10-04 ENCOUNTER — Telehealth: Payer: Self-pay | Admitting: Urology

## 2020-10-04 NOTE — Progress Notes (Signed)
  Subjective:  Patient ID: Wayne Forbes, male    DOB: August 13, 1960,  MRN: [id number]  Chief Complaint  Patient presents with   Foot Pain    Follow up right foot pain. Pt states pain is still constant and unchanged. Pt is alos here for Results review of MRI.    60 y.o. male presents with the above complaint. History confirmed with patient.   Objective:  Physical Exam: warm, good capillary refill, no trophic changes or ulcerative lesions, normal DP and PT pulses and normal sensory exam.  Right Foot: TTP right 5th met base at PB insertion   Study Result  Narrative & Impression  CLINICAL DATA:  Lateral ankle pain for 6 months. No known injury. No relief from ankle injection. Tendon abnormality suspected.   EXAM: MRI OF THE RIGHT ANKLE WITHOUT CONTRAST   TECHNIQUE: Multiplanar, multisequence MR imaging of the ankle was performed. No intravenous contrast was administered.   COMPARISON:  Foot radiographs 07/31/2019   FINDINGS: TENDONS   Peroneal: Os peroneum noted with mild marrow edema and adjacent peroneus longus tendinosis and tenosynovitis. There is some reactive edema in the inferior aspect of the cuboid. No full-thickness tendon tear identified. The peroneus brevis tendon appears normal.   Posteromedial: Intact and normally positioned.   Anterior: Intact and normally positioned.   Achilles: Intact.   Plantar Fascia: Intact.   LIGAMENTS   Lateral: The anterior and posterior talofibular and calcaneofibular ligaments are intact.The inferior tibiofibular ligaments appear intact.   Medial: The deltoid and visualized portions of the spring ligament appear intact.   CARTILAGE AND BONES   Ankle Joint: No significant ankle joint effusion. The talar dome and tibial plafond are intact.   Subtalar Joints/Sinus Tarsi: Unremarkable.   Bones: No significant extra-articular osseous findings.   Other: Possible small ganglion posterior to the distal fibula adjacent  to the posterior talofibular ligament.   IMPRESSION: 1. Peroneus longus tenosynovitis with marrow edema within an os peroneum and the adjacent cuboid. No full-thickness tendon tear. 2. The ankle ligaments and additional ankle tendons appear intact. 3. No acute osseous findings or significant arthropathic changes.     Assessment:   1. Peroneal tendonitis of right lower leg   2. Tendonitis   3. Peroneal tendonitis of right lower extremity      Plan:  Patient was evaluated and treated and all questions answered.  Peroneal tendonitis right -MRI reviewed with patient -We discussed further treatment for this including surgical vs non-surgical therapy. -Patient has failed all conservative therapy and wishes to proceed with surgical intervention. All risks, benefits, and alternatives discussed with patient. No guarantees given. Consent reviewed and signed by patient. -Planned procedures: right ankle removal of os peroneum, peroneal tendon debridement and repair as indicated, possible 5th metatarsal resection with reattachment of tendon        No follow-ups on file.

## 2020-10-04 NOTE — Telephone Encounter (Signed)
DOS - 10/09/20  PARTIAL EXCISION OF METATARSAL BONE RT --- 28122 REPAIR POST TIBAL TENDON/ SECONDARY RT --- 99242   [employer] EFFECTIVE DATE - 03/09/10   PLAN DEDUCTIBLE - $600.00 W/ $0.00 REMAINING OUT OF POCKET - $3,000.00 W/ $0.00 REMAINING COINSURANCE - 20% COPAY - $0.00   SPOKE WITH RACHEL WITH [employer] AND SHE STATED THAT FOR CPT CODES 68341 AND [id number] NO PRIOR AUTH IS REQUIRED.  REF # [id number]

## 2020-10-09 ENCOUNTER — Encounter: Payer: Self-pay | Admitting: Podiatry

## 2020-10-09 ENCOUNTER — Other Ambulatory Visit: Payer: Self-pay | Admitting: Podiatry

## 2020-10-09 DIAGNOSIS — M25774 Osteophyte, right foot: Secondary | ICD-10-CM | POA: Diagnosis not present

## 2020-10-09 DIAGNOSIS — M65871 Other synovitis and tenosynovitis, right ankle and foot: Secondary | ICD-10-CM | POA: Diagnosis not present

## 2020-10-09 DIAGNOSIS — M8930 Hypertrophy of bone, unspecified site: Secondary | ICD-10-CM | POA: Diagnosis not present

## 2020-10-09 DIAGNOSIS — M7671 Peroneal tendinitis, right leg: Secondary | ICD-10-CM | POA: Diagnosis not present

## 2020-10-09 DIAGNOSIS — M66371 Spontaneous rupture of flexor tendons, right ankle and foot: Secondary | ICD-10-CM | POA: Diagnosis not present

## 2020-10-09 MED ORDER — CEPHALEXIN 500 MG PO CAPS: ORAL_CAPSULE | ORAL | 0 refills | Status: AC

## 2020-10-09 MED ORDER — OXYCODONE HCL 15 MG PO TABS: 15.0000 mg | ORAL_TABLET | ORAL | 0 refills | Status: DC | PRN

## 2020-10-10 ENCOUNTER — Telehealth: Payer: Self-pay | Admitting: *Deleted

## 2020-10-10 MED ORDER — OXYCODONE HCL 20 MG PO TABS: 15.0000 mg | ORAL_TABLET | ORAL | 0 refills | Status: DC | PRN

## 2020-10-10 NOTE — Telephone Encounter (Signed)
Patient is calling because he may need something a little stronger,pain is waking up at night. Please advise.

## 2020-10-10 NOTE — Telephone Encounter (Signed)
Called patient and short term dose increase on pain medication - previous regimen not working

## 2020-10-11 ENCOUNTER — Encounter: Payer: Self-pay | Admitting: Podiatry

## 2020-10-11 NOTE — Progress Notes (Signed)
DOS 10/09/20 Procedure:  Partial exc of met bone Rt Repair ost tibial tendon/secondary Rt

## 2020-10-14 ENCOUNTER — Telehealth: Payer: Self-pay | Admitting: *Deleted

## 2020-10-14 MED ORDER — OXYCODONE HCL 20 MG PO TABS: 20.0000 mg | ORAL_TABLET | ORAL | 0 refills | Status: DC | PRN

## 2020-10-14 NOTE — Telephone Encounter (Signed)
Patient is calling for the status of a increase in pain medicine(Oxycodone-20 mg) that was supposed to be sent to pharmacy. Explained that I will call them for the status ,showing  that it was sent per epic, will call him back.  He is also wanting to let the doctor know that he has continue to run a low temperature since the surgery(99.0),should he be concerned? Please advise.

## 2020-10-14 NOTE — Telephone Encounter (Signed)
Returned call to patient and gave recommendations per Dr Samuella Cota and that the prescription has been resent to pharmacy,verbalized understanding.

## 2020-10-15 ENCOUNTER — Ambulatory Visit (INDEPENDENT_AMBULATORY_CARE_PROVIDER_SITE_OTHER): Payer: BC Managed Care – PPO | Admitting: Podiatry

## 2020-10-15 ENCOUNTER — Ambulatory Visit (INDEPENDENT_AMBULATORY_CARE_PROVIDER_SITE_OTHER): Payer: BC Managed Care – PPO

## 2020-10-15 ENCOUNTER — Other Ambulatory Visit: Payer: Self-pay

## 2020-10-15 DIAGNOSIS — Z9889 Other specified postprocedural states: Secondary | ICD-10-CM

## 2020-10-15 NOTE — Progress Notes (Signed)
  Subjective:  Patient ID: Wayne Forbes, male    DOB: [birth year],  MRN: [id number]  Chief Complaint  Patient presents with   Routine Post Op    POV #1 DOS 10/09/20 EXC OF PERONEUM PERONEAL TENDON DEBRIDE AND REPAIR , POSSIBLE 5TH MET RESECTION WITH REATTACHMENT OF TENDON. Pt has complains of fever and chills.    DOS: 10/09/20 Procedure: Repair of peroneal tendon, removal of os peroneum, 5th met exostectomy  60 y.o. male presents with the above complaint. History confirmed with patient.   Objective:  Physical Exam: tenderness at the surgical site, local edema noted, and calf supple, nontender. Incision: healing well, no significant drainage, no dehiscence, no significant erythema  No images are attached to the encounter.  Radiographs: X-ray of the right foot: consistent with post-op state interval removal of os peroneum  Assessment:   1. Post-operative state    Plan:  Patient was evaluated and treated and all questions answered.  Post-operative State -XR reviewed with patient -Dressing applied consisting of antibiotic ointment, sterile gauze, kerlix, and ACE bandage -NWB with knee scooter -XRs needed at follow-up: none -Possible staple removal next visit, cast application  Return in about 1 month (around 11/15/2020) for Post-Op (No XRs).

## 2020-10-21 ENCOUNTER — Telehealth: Payer: Self-pay | Admitting: Podiatry

## 2020-10-21 ENCOUNTER — Ambulatory Visit: Payer: BC Managed Care – PPO | Admitting: Pulmonary Disease

## 2020-10-21 DIAGNOSIS — M722 Plantar fascial fibromatosis: Secondary | ICD-10-CM

## 2020-10-21 MED ORDER — DEXAMETHASONE SODIUM PHOSPHATE 120 MG/30ML IJ SOLN: 4.0000 mg | Freq: Once | INTRAMUSCULAR | Status: DC

## 2020-10-21 MED ORDER — OXYCODONE HCL 20 MG PO TABS: 20.0000 mg | ORAL_TABLET | ORAL | 0 refills | Status: DC | PRN

## 2020-10-21 NOTE — Telephone Encounter (Signed)
Pt called requesting refill on pain meds. Please advise.  

## 2020-10-21 NOTE — Addendum Note (Signed)
Addended by: Ventura Sellers on: 10/21/2020 12:03 PM   Modules accepted: Orders

## 2020-10-22 ENCOUNTER — Other Ambulatory Visit: Payer: Self-pay

## 2020-10-22 ENCOUNTER — Ambulatory Visit (INDEPENDENT_AMBULATORY_CARE_PROVIDER_SITE_OTHER): Payer: BC Managed Care – PPO | Admitting: Podiatry

## 2020-10-22 DIAGNOSIS — M722 Plantar fascial fibromatosis: Secondary | ICD-10-CM

## 2020-10-22 DIAGNOSIS — Z9889 Other specified postprocedural states: Secondary | ICD-10-CM

## 2020-10-22 NOTE — Progress Notes (Signed)
  Subjective:  Patient ID: Wayne Forbes, male    DOB: March 02, 1961,  MRN: [id number]  Chief Complaint  Patient presents with   Routine Post Op    POV #2 DOS 10/09/20 EXC OF PERONEUM PERONEAL TENDON DEBRIDE AND REPAIR , POSSIBLE 5TH MET RESECTION WITH REATTACHMENT OF TENDON. Pt states he has been doing well.    DOS: 10/09/20 Procedure: Repair of peroneal tendon, removal of os peroneum, 5th met exostectomy  60 y.o. male presents with the above complaint. History confirmed with patient.   Objective:  Physical Exam: tenderness at the surgical site, local edema noted, and calf supple, nontender. Incision: healing well, no significant drainage, no dehiscence, no significant erythema Assessment:   1. Plantar fibromatosis   2. Post-operative state     Plan:  Patient was evaluated and treated and all questions answered.  Post-operative State -Every other staple removed today. Ok to shower and get wet. -Patient has mild swelling today. Hold off cast. Ok to immobilize in boot but he is to remain NWB. -Further suture and staple removal at this time. -XRs needed at follow-up: none  No follow-ups on file.

## 2020-10-29 ENCOUNTER — Encounter: Payer: BC Managed Care – PPO | Admitting: Podiatry

## 2020-11-01 DIAGNOSIS — R7989 Other specified abnormal findings of blood chemistry: Secondary | ICD-10-CM | POA: Diagnosis not present

## 2020-11-05 ENCOUNTER — Ambulatory Visit (INDEPENDENT_AMBULATORY_CARE_PROVIDER_SITE_OTHER): Payer: BC Managed Care – PPO | Admitting: Podiatry

## 2020-11-05 ENCOUNTER — Other Ambulatory Visit: Payer: Self-pay

## 2020-11-05 DIAGNOSIS — M779 Enthesopathy, unspecified: Secondary | ICD-10-CM

## 2020-11-05 DIAGNOSIS — M7671 Peroneal tendinitis, right leg: Secondary | ICD-10-CM

## 2020-11-05 DIAGNOSIS — Z9889 Other specified postprocedural states: Secondary | ICD-10-CM

## 2020-11-05 MED ORDER — OXYCODONE HCL 15 MG PO TABS: 15.0000 mg | ORAL_TABLET | ORAL | 0 refills | Status: DC | PRN

## 2020-11-05 NOTE — Progress Notes (Signed)
  Subjective:  Patient ID: Wayne Forbes, male    DOB: 06/12/1960,  MRN: [id number]  Chief Complaint  Patient presents with   Routine Post Op    POV #3 DOS 10/09/20 EXC OF PERONEUM PERONEAL TENDON DEBRIDE AND REPAIR , POSSIBLE 5TH MET RESECTION WITH REATTACHMENT OF TENDON. Pt states he is doing okay. He has not put any weight on his foot so he is unsure about his healing.    DOS: 10/09/20 Procedure: Repair of peroneal tendon, removal of os peroneum, 5th met exostectomy  60 y.o. male presents with the above complaint. History confirmed with patient.   Objective:  Physical Exam: tenderness at the surgical site, local edema noted, and calf supple, nontender. Incision: healing well, no significant drainage, no dehiscence, no significant erythema Assessment:   1. Post-operative state   2. Peroneal tendonitis of right lower leg   3. Tendonitis    Plan:  Patient was evaluated and treated and all questions answered.  Post-operative State -Sutures removed today. Staples left intact. Ok to shower but not soak -At this point it is ok to proceed with WBAT in the CAM boot. I advised should he have significant pain to resume use of the knee scooter. -WBAT in CAM walker boot. Still will not be ready to drive for 2-3 weeks. -Remainder of staple removal next visit. -Refill pain rx. -XRs needed at follow-up: none  Return in about 2 weeks (around 11/19/2020) for Post-Op (No XRs).

## 2020-11-06 ENCOUNTER — Encounter: Payer: Self-pay | Admitting: Podiatry

## 2020-11-14 ENCOUNTER — Telehealth: Payer: Self-pay | Admitting: *Deleted

## 2020-11-14 NOTE — Telephone Encounter (Signed)
Patient is calling to request a refill of his pain medicine(oxycodone- 15 mg). Please advise.

## 2020-11-15 ENCOUNTER — Other Ambulatory Visit: Payer: Self-pay | Admitting: Sports Medicine

## 2020-11-15 DIAGNOSIS — G4733 Obstructive sleep apnea (adult) (pediatric): Secondary | ICD-10-CM | POA: Diagnosis not present

## 2020-11-15 MED ORDER — OXYCODONE HCL 15 MG PO TABS: 15.0000 mg | ORAL_TABLET | ORAL | 0 refills | Status: DC | PRN

## 2020-11-15 NOTE — Telephone Encounter (Signed)
Patient is calling to request a refill of his pain medicine(oxycodone- 15 mg). Please advise. 

## 2020-11-15 NOTE — Progress Notes (Signed)
Refilled oxycodone.

## 2020-11-19 ENCOUNTER — Ambulatory Visit (INDEPENDENT_AMBULATORY_CARE_PROVIDER_SITE_OTHER): Payer: BC Managed Care – PPO | Admitting: Podiatry

## 2020-11-19 ENCOUNTER — Other Ambulatory Visit: Payer: Self-pay

## 2020-11-19 DIAGNOSIS — M7671 Peroneal tendinitis, right leg: Secondary | ICD-10-CM

## 2020-11-19 MED ORDER — OXYCODONE HCL 15 MG PO TABS: 15.0000 mg | ORAL_TABLET | ORAL | 0 refills | Status: DC | PRN

## 2020-11-20 DIAGNOSIS — K5903 Drug induced constipation: Secondary | ICD-10-CM | POA: Diagnosis not present

## 2020-11-20 DIAGNOSIS — G894 Chronic pain syndrome: Secondary | ICD-10-CM | POA: Diagnosis not present

## 2020-11-20 DIAGNOSIS — M47816 Spondylosis without myelopathy or radiculopathy, lumbar region: Secondary | ICD-10-CM | POA: Diagnosis not present

## 2020-11-20 DIAGNOSIS — G4700 Insomnia, unspecified: Secondary | ICD-10-CM | POA: Diagnosis not present

## 2020-11-25 ENCOUNTER — Ambulatory Visit: Payer: BC Managed Care – PPO | Admitting: Cardiology

## 2020-11-25 NOTE — Progress Notes (Signed)
  Subjective:  Patient ID: Wayne Forbes, male    DOB: [birth year],  MRN: [id number]  Chief Complaint  Patient presents with   Routine Post Op       POV #4 DOS 10/09/20 EXC OF PERONEUM PERONEAL TENDON DEBRIDE AND REPAIR , POSSIBLE 5TH MET RESECTION WITH REATTACHMENT OF TENDON   DOS: 10/09/20 Procedure: Repair of peroneal tendon, removal of os peroneum, 5th met exostectomy  60 y.o. male presents with the above complaint. History confirmed with patient.  Doing rather well still having some pain while weightbearing  Objective:  Physical Exam: tenderness at the surgical site, local edema noted, and calf supple, nontender. Incision: Well-healed Assessment:   1. Peroneal tendonitis of right lower leg    Plan:  Patient was evaluated and treated and all questions answered.  Post-operative State -Remainder of staples removed today.  Continue weightbearing in cam boot -Repeat for to physical therapy for passive and active range of motion exercises and strengthening and weightbearing training -Refill pain rx. -Plan to transition to ankle brace and sneaker at next visit -XRs needed at follow-up: none  Return in about 2 weeks (around 12/03/2020) for Post-Op (No XRs).

## 2020-11-28 DIAGNOSIS — R7989 Other specified abnormal findings of blood chemistry: Secondary | ICD-10-CM | POA: Diagnosis not present

## 2020-12-03 ENCOUNTER — Encounter: Payer: Self-pay | Admitting: Podiatry

## 2020-12-05 ENCOUNTER — Other Ambulatory Visit: Payer: Self-pay

## 2020-12-05 ENCOUNTER — Ambulatory Visit: Payer: BC Managed Care – PPO | Attending: Podiatry | Admitting: Physical Therapy

## 2020-12-05 ENCOUNTER — Encounter: Payer: Self-pay | Admitting: Physical Therapy

## 2020-12-05 DIAGNOSIS — M25571 Pain in right ankle and joints of right foot: Secondary | ICD-10-CM | POA: Diagnosis not present

## 2020-12-05 DIAGNOSIS — R2689 Other abnormalities of gait and mobility: Secondary | ICD-10-CM | POA: Diagnosis not present

## 2020-12-05 DIAGNOSIS — M6281 Muscle weakness (generalized): Secondary | ICD-10-CM | POA: Insufficient documentation

## 2020-12-05 NOTE — Patient Instructions (Signed)
Access Code: [email or web address] URL: [email or web address] Date: 12/05/2020 Prepared by: Alphonzo Severance  Exercises Ankle Alphabet in Elevation - 3 x daily - 7 x weekly - 2 sets - 1 reps Side to Side Weight Shift with Counter Support - 2 x daily - 7 x weekly - 2 sets - 10 reps Seated Toe Towel Scrunches - 2 x daily - 7 x weekly - 3 sets - 1 reps

## 2020-12-05 NOTE — Therapy (Signed)
[facility] [place], [place], [address] Phone: [phone]   Fax:  [phone]  Physical Therapy Evaluation  Patient Details  Name: Wayne Forbes MRN: [id number] Date of Birth: 28-Feb-1961 Referring Provider (PT): Evelina Bucy, DPM  Encounter Date: 12/05/2020   PT End of Session - 12/05/20 1813     Visit Number 1    Number of Visits 16    Date for PT Re-Evaluation 01/30/21    Authorization Type BCBS    PT Start Time 1347    PT Stop Time 1430    PT Time Calculation (min) 43 min             Past Medical History:  Diagnosis Date   Alcohol use    Apnea, sleep    does not wear cpap all time   Calculus of kidney    Constipation    Cough    COVID-19    DDD (degenerative disc disease), lumbar    Elevated ferritin    Elevated liver function tests    Endothelial corneal dystrophy, unspecified eye    Fatty liver 2019   Fuchs' corneal dystrophy    High cholesterol    Insomnia    Kidney stone    Kidney stones    LV dysfunction    OSA (obstructive sleep apnea)    Shingles    Sinusitis    Spondylolisthesis of lumbar region    Steatohepatitis     Past Surgical History:  Procedure Laterality Date   EXTRACORPOREAL SHOCK WAVE LITHOTRIPSY Right 05/26/2018   Procedure: EXTRACORPOREAL SHOCK WAVE LITHOTRIPSY (ESWL);  Surgeon: Ardis Hughs, MD;  Location: [facility];  Service: Urology;  Laterality: Right;   EXTRACORPOREAL SHOCK WAVE LITHOTRIPSY Right 05/30/2018   Procedure: EXTRACORPOREAL SHOCK WAVE LITHOTRIPSY (ESWL);  Surgeon: Kathie Rhodes, MD;  Location: [facility];  Service: Urology;  Laterality: Right;   TENDON REPAIR Right    arm    There were no vitals filed for this visit.    Subjective Assessment - 12/05/20 1810     Subjective Wayne Forbes is a 60 y.o. male who presents to clinic with chief complaint of R ankle pain following repair of peroneal tendon, removal of os peroneum, 5th met  exostectomy on 10/09/2020.  MOI/History of condition: Insidious onset of R lateral ankle pain after extensive walking at work.  Following MRI showing some damage to  he had peroneal tendon repair on 10/09/2020.  Pain location: over incision, lateral R foot, dorsal surface of toes.  Red flags: some numbness around incision site.  48 hour pain intensity:  highest 5-6/10, current 0/10, best 0/10.  Aggs: walking (10 min), standing (10 min).  Eases: elevation, .  Nature: aching, stinging.  Severity: mod.  Irritability: mod.  Stage: sub acute.  Stability: getting better.  24 hour pattern: NA.  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  Hobbies: NA.  Functional limitations/goals: walking, driving.  Home environment: lives with wife, 4 STE w/ railing.  Assistive device: crutches PRN.   Hand dominance: R.  Falls: fell multiple times using knee scooter.    Pertinent History Significant PMH: none                [facility] PT Assessment - 12/05/20 0001       Assessment   Medical Diagnosis Referral diagnosis: Peroneal tendonitis of right lower leg (M76.71)    Referring Provider (PT) Evelina Bucy, DPM    Onset Date/Surgical Date  10/09/20    Hand Dominance Right      Precautions   Precaution Comments Significant PMH: none      Restrictions   Weight Bearing Restrictions Yes    Other Position/Activity Restrictions repair of peroneal tendon, removal of os peroneum, 5th met exostectomy on 10/09/2020 (anticipate WBAT with lace up AFO at next MD visit, but confirm)      Balance Screen   Has the patient fallen in the past 6 months No      Prior Function   Level of Independence Independent      Observation/Other Assessments   Observations moderate swelling surrounding lateral malleolus    Skin Integrity incision is well healing with some scab formation along incision, no erythema present    Focus on Therapeutic Outcomes (FOTO)  43 -> 63      Sensation   Light Touch Impaired Detail    reduced sensation around incision     ROM / Strength   AROM / PROM / Strength Strength;AROM      AROM   AROM Assessment Site Ankle    Right/Left Ankle Right;Left    Right Ankle Dorsiflexion 3    Right Ankle Plantar Flexion 40    Right Ankle Inversion 20    Right Ankle Eversion 35    Left Ankle Dorsiflexion 12    Left Ankle Plantar Flexion --   wnl   Left Ankle Inversion --   wnl   Left Ankle Eversion --   WNL     Strength   Overall Strength Comments all LE strength test with exception of R ankle WNL    Strength Assessment Site Knee;Ankle;Hip    Right/Left Hip Right;Left    Right/Left Knee Right;Left    Right/Left Ankle Right;Left                        Objective measurements completed on examination: See above findings.                PT Education - 12/05/20 1810     Education Details POC, diagnosis, prognosis, HEP, FOTO.  Pt educated via explanation, demonstration, and handout (HEP).  Pt confirms understanding verbally.              PT Short Term Goals - 12/05/20 1820       PT SHORT TERM GOAL #1   Title ESSIE GEHRET will be >75% HEP compliant to improve carryover between sessions and facilitate independent management of condition    Target Date 01/02/21               PT Long Term Goals - 12/05/20 1820       PT LONG TERM GOAL #1   Title Serita Kyle will achieve greater than 40 degrees of closed chain right ankle DF of the affected ankle (inclinometer placed just distal to tibial tuberosity; "zeroed" vertically; 1/2 kneeling if able; measured on forward leg  ideal is between 40 - 50 degrees) to normalize terminal stance phase of gait and reduce stress on posterior ankle structures  EVAL: 3 degrees non weight bearing  target date: 01/30/21      PT LONG TERM GOAL #2   Title CAETANO OBERHAUS will be able to stand for >40'' in SLS stance, to show a significant improvement in balance in order to reduce fall risk   EVAL: unable  target date: 01/30/21      PT LONG TERM GOAL #3  Title TAKESHI TEASDALE will improve FOTO score from 43 (on evaluation) to 63 as a proxy for functional improvement  target date: 01/30/21      PT LONG TERM GOAL #4   Title JESSEY STEHLIN will report >/= 50% decrease in pain from evaluation  EVAL: 6/10 max pain  target date: 01/30/21      PT LONG TERM GOAL #5   Title WILLOUGHBY DOELL will be able to return to work, not limited by pain  EVAL: limited by pain  target date: 01/30/21                    Plan - 12/05/20 1816     Clinical Impression Statement MUHANNAD BIGNELL is a 60 y.o. male who presents to clinic with signs and sxs consistent with repair of peroneal tendon, removal of os peroneum, 5th met exostectomy on 10/09/2020.  Pt presents with pain and impairments/deficits in: ankle DF ROM, ankle strength, gait, balance.  Activity limitations include: walking, transfers, ambulation.  Participation limitations include: work, standing, yardwork, housework.  Pt will benefit from skilled therapy to address pain and the listed deficits in order to achieve functional goals, enable safety and independence in completion of daily tasks, and return to PLOF.    Stability/Clinical Decision Making Stable/Uncomplicated    Clinical Decision Making Low    Rehab Potential Good    PT Frequency 2x / week    PT Duration 8 weeks    PT Treatment/Interventions ADLs/Self Care Home Management;Iontophoresis 30m/ml Dexamethasone;Electrical Stimulation;Therapeutic activities;Gait training;Therapeutic exercise;Neuromuscular re-education;Manual techniques;Dry needling;Vasopneumatic Device;Joint Manipulations    PT Next Visit Plan assess HEP and w/b status after MD visit.  Progressive W/B and AROM.  Strengthening as appropriate.    PT Home Exercise Plan [id number]   Consulted and Agree with Plan of Care Patient             Patient will benefit from skilled  therapeutic intervention in order to improve the following deficits and impairments:  Abnormal gait, Decreased balance, Pain, Decreased strength, Decreased range of motion  Visit Diagnosis: Pain in right ankle and joints of right foot  Muscle weakness  Balance problem  Other abnormalities of gait and mobility     Problem List Patient Active Problem List   Diagnosis Date Noted   Acquired spondylolisthesis 07/09/2020   Alcohol abuse 07/09/2020   Chronic sinusitis 07/09/2020   Constipation 07/09/2020   Cough variant asthma 07/09/2020   Degeneration of lumbar intervertebral disc 07/09/2020   Elevated ferritin 07/09/2020   Endothelial corneal dystrophy, unspecified eye 07/09/2020   Heart disease 07/09/2020   Primary insomnia 07/09/2020   Fatty liver 07/09/2020   Unspecified abnormal finding in specimens from other organs, systems and tissues 07/09/2020   Kidney stone 07/31/2019   Moderate obstructive sleep apnea 06/07/2018   TMJ dysfunction 11/26/2015    KMathis Dad PT 12/05/2020, 6:26 PM  [facility]G[place] [address]Phone: [phone]  Fax:  [phone] Name: JDEMETRIC DUNNAWAYMRN: [id number] of Birth: [birth year]

## 2020-12-05 NOTE — Addendum Note (Signed)
Addended by: Alphonzo Severance E on: 12/05/2020 06:30 PM   Modules accepted: Orders

## 2020-12-06 ENCOUNTER — Encounter: Payer: Self-pay | Admitting: Pulmonary Disease

## 2020-12-06 ENCOUNTER — Ambulatory Visit (INDEPENDENT_AMBULATORY_CARE_PROVIDER_SITE_OTHER): Payer: BC Managed Care – PPO | Admitting: Podiatry

## 2020-12-06 ENCOUNTER — Ambulatory Visit (INDEPENDENT_AMBULATORY_CARE_PROVIDER_SITE_OTHER): Payer: BC Managed Care – PPO | Admitting: Pulmonary Disease

## 2020-12-06 VITALS — BP 108/78 | HR 91 | Temp 98.3°F | Ht 68.0 in | Wt 214.6 lb

## 2020-12-06 DIAGNOSIS — M7671 Peroneal tendinitis, right leg: Secondary | ICD-10-CM

## 2020-12-06 DIAGNOSIS — Z9889 Other specified postprocedural states: Secondary | ICD-10-CM

## 2020-12-06 DIAGNOSIS — G4733 Obstructive sleep apnea (adult) (pediatric): Secondary | ICD-10-CM | POA: Diagnosis not present

## 2020-12-06 DIAGNOSIS — J984 Other disorders of lung: Secondary | ICD-10-CM

## 2020-12-06 DIAGNOSIS — M779 Enthesopathy, unspecified: Secondary | ICD-10-CM

## 2020-12-06 MED ORDER — OXYCODONE HCL 15 MG PO TABS: 15.0000 mg | ORAL_TABLET | ORAL | 0 refills | Status: DC | PRN

## 2020-12-06 MED ORDER — IPRATROPIUM BROMIDE 0.06 % NA SOLN: 2.0000 | Freq: Four times a day (QID) | NASAL | 3 refills | Status: AC

## 2020-12-06 NOTE — Patient Instructions (Addendum)
Shortness of breath, wheezing Restrictive lung defect Likely environmental related. No evidence of parenchymal disease on CT in 2019 --CONTINUE Symbicort 160-4.5 mcg TWO puffs TWICE a day. --START atrovent nasal spray 1-2 sprays per nose twice a day as needed  Moderate Obstructive Sleep Apnea Home sleep study - AHI 21.4 with nadir oxygen 78% Patient uses NIV for more than four hours nightly for more then 4 hours per night on 70% of nights during the last three months of usage. --Counseled on sleep hygiene --Counseled on weight loss/maintenance of healthy weight --Counseled NOT to drive if/when sleepy --Advised patient to wear CPAP for at least 4 hours each night for greater than 70% of the time to avoid the machine being repossessed by insurance. --REFER to ENT to evaluate Inspire candidacy  Follow-up with me in 6 months

## 2020-12-06 NOTE — Progress Notes (Signed)
  Subjective:  Patient ID: Wayne Forbes, male    DOB: 1960-06-28,  MRN: [id number]  Chief Complaint  Patient presents with   Routine Post Op    POV DOS 8.3.2022 Pt states improvement, still experiencing some pain lateral aspect right foot.   DOS: 10/09/20 Procedure: Repair of peroneal tendon, removal of os peroneum, 5th met exostectomy  60 y.o. male presents with the above complaint. History confirmed with patient. States that there is some pain when walking but it is improving. He has pain with associated swelling which relieves upon elevation  Objective:  Physical Exam: tenderness at the surgical site, local edema noted, and calf supple, nontender. Incision: Well-healed Assessment:   1. Peroneal tendonitis of right lower leg   2. Post-operative state   3. Tendonitis     Plan:  Patient was evaluated and treated and all questions answered.  Post-operative State -Small retained suture removed. Wound almost fully healed - small eschar which was debrided today -Good strength noted today. Continue PT for strengthening and ROM, gait training. -Transition to trilock ankle brace and sneaker today. This was dispensed. -XRs needed at follow-up: none  No follow-ups on file.

## 2020-12-06 NOTE — Progress Notes (Signed)
Synopsis: Referred in 12/2017 for chronic cough  Subjective:   PATIENT ID: Wayne Forbes GENDER: male DOB: [birth year], MRN: [id number]   HPI  Chief Complaint  Patient presents with   Follow-up   [name] is a 60 year old male never smoker who presents for follow-up.  01/28/18 On his last visit, he was evaluated for a 3-year history of nonproductive cough that has worsened and increased in frequency in the last year.  Cough occurs throughout the day and night.  Patient cannot identify any triggers.  He has tried [employer] and a trial of Breo and over-the-counter medications with no changes in his cough.  However after using albuterol on the day of his PFTs he noticed that he did not cough for 2 to 3 days.  Denies associated shortness of breath, sputum production or wheezing.  Denies allergies, reflux.  He is active at baseline and enjoys hunting and working out twice a week which includes cardio and weight training.  12/06/20 For his OSA he reports wearing his CPAP nightly for at least 4 hours. Denies shortness of breath. He has noticed improvement in quality of sleep and daytime sleepiness. He is considering weight loss but not enrolled in any program at this time. He has been on short term disability after a foot injury.  He has been on Symbicort twice a day and helps wheezing and shortness of breath.   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 States that environment is well ventilated.  He is required to perform pulmonary function tests every 2 years at work which the results have been normal per patient. He is a non-smoker but reports childhood exposure to second-hand smoking.     Past Medical History:  Diagnosis Date   Alcohol use    Apnea, sleep    does not wear cpap all time   Calculus of kidney    Constipation    Cough    COVID-19    DDD  (degenerative disc disease), lumbar    Elevated ferritin    Elevated liver function tests    Endothelial corneal dystrophy, unspecified eye    Fatty liver 2019   Fuchs' corneal dystrophy    High cholesterol    Insomnia    Kidney stone    Kidney stones    LV dysfunction    OSA (obstructive sleep apnea)    Shingles    Sinusitis    Spondylolisthesis of lumbar region    Steatohepatitis      No Known Allergies   Outpatient Medications Prior to Visit  Medication Sig Dispense Refill   albuterol (ACCUNEB) 0.63 MG/3ML nebulizer solution Inhale into the lungs.     budesonide-formoterol (SYMBICORT) 160-4.5 MCG/ACT inhaler Inhale 1 puff into the lungs 2 (two) times daily.     fluticasone (FLONASE) 50 MCG/ACT nasal spray 1 spray in each nostril     Melatonin 10 MG TABS Take by mouth.     Methylnaltrexone Bromide (RELISTOR PO) Take by mouth.     oxyCODONE (ROXICODONE) 15 MG immediate release tablet Take 1 tablet (15 mg total) by mouth every 4 (four) hours as needed for pain. 30 tablet 0   Probiotic CAPS Take 1 capsule by mouth daily.     valACYclovir (VALTREX) 500 MG tablet Take 1,000 mg by mouth daily as needed (fever blisters).     cephALEXin (KEFLEX) 500 MG capsule Take 1 tablet by mouth  tonight, then one tomorrow AM and one in PM (Patient not taking: Reported on 12/06/2020) 3 capsule 0   cyclobenzaprine (FLEXERIL) 10 MG tablet  (Patient not taking: Reported on 12/06/2020)     escitalopram (LEXAPRO) 10 MG tablet Take 1 tablet by mouth daily. (Patient not taking: Reported on 12/06/2020)     FLUoxetine (PROZAC) 20 MG capsule Take 20 mg by mouth daily. (Patient not taking: Reported on 12/06/2020)     metoprolol tartrate (LOPRESSOR) 100 MG tablet Take 1 tablet (100 mg total) by mouth once for 1 dose. 1 tablet 0   Facility-Administered Medications Prior to Visit  Medication Dose Route Frequency Provider Last Rate Last Admin   betamethasone acetate-betamethasone sodium phosphate (CELESTONE)  injection 3 mg  3 mg Other [place], DPM        Review of Systems  Constitutional:  Negative for chills, diaphoresis, fever, malaise/fatigue and weight loss.  HENT:  Negative for congestion.   Respiratory:  Positive for shortness of breath and wheezing. Negative for cough, hemoptysis and sputum production.   Cardiovascular:  Negative for chest pain, palpitations and leg swelling.     Objective:    Vitals:   12/06/20 1546  BP: 108/78  Pulse: 91  Temp: 98.3 F (36.8 C)  TempSrc: Oral  SpO2: 97%  Weight: 214 lb 9.6 oz (97.3 kg)  Height: 5\' 8"  (1.727 m)   Physical Exam: General: Well-appearing, no acute distress HENT: [place], AT Eyes: EOMI, no scleral icterus Respiratory: Clear to auscultation bilaterally.  No crackles, wheezing or rales Cardiovascular: RRR, -M/R/G, no JVD Extremities:-Edema,-tenderness Neuro: AAO x4, CNII-XII grossly intact Psych: Normal mood, normal affect  Data Reviewed:  Chest imaging: CXR 7/28 4/16-unremarkable with no evidence of acute infiltrates, effusions, edema.  Left lower lobe calcification. CT Chest 02/22/18 - Old granulomas CT Coronary 10/02/20 - No CAD. Lung parenchyma with no evidence of parenchymal abnormalities. No infiltrates, effusion or edema  PFT:  01/17/18 - Mild restrictive defect. No obstruction or significant bronchodilator effect. Normal DLCO  Sleep study: Home sleep study from 01/17/18 - moderate obstructive sleep apnea with an AHI of 21.4 and SpO2 low of 78%.  CPAP compliance 09/05/20-12/06/20 Days Used 48/90 (53%) Days >4 hours 15/90 Avergae use 3.6 hours nightly No leak Pressure 9.7 mmHg AHI 0.3  Assessment & Plan:   Discussion: 60 year old male never smoker with OSA on CPAP and mild restrictive lung defect who presents for follow-up. 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   We discussed management of OSA. He reports difficulty maintaining compliance with  CPAP but does have benefit with NIV. He is interested in [place] device.  Moderate Obstructive Sleep Apnea Home sleep study - AHI 21.4 with nadir oxygen 78% Patient uses NIV for more than four hours nightly for more then 4 hours per night on 70% of nights during the last three months of usage. --Counseled on sleep hygiene --Counseled on weight loss/maintenance of healthy weight --Counseled NOT to drive if/when sleepy --Advised patient to wear CPAP for at least 4 hours each night for greater than 70% of the time to avoid the machine being repossessed by insurance. --REFER to ENT to evaluate Inspire candidacy  Shortness of breath, wheezing Restrictive lung defect Likely environmental related. No evidence of parenchymal disease on CT in 2019 --CONTINUE Symbicort 160-4.5 mcg TWO puffs TWICE a day. --START atrovent nasal spray 1-2 sprays per nose twice a day as needed  Health Maintenance Immunization History  Administered  Date(s) Administered   Influenza Inj Mdck Quad Pf 12/28/2017   Influenza Split 12/21/2016, 12/07/2017, 11/08/2019   Influenza,inj,Quad PF,6+ Mos 12/28/2018   PFIZER(Purple Top)SARS-COV-2 Vaccination 08/17/2019, 09/07/2019   Tdap 06/30/2011   Orders Placed This Encounter  Procedures   Ambulatory referral to ENT    Referral Priority:   Routine    Referral Type:   Consultation    Referral Reason:   Specialty Services Required    Requested Specialty:   Otolaryngology    Number of Visits Requested:   1   Meds ordered this encounter  Medications   ipratropium (ATROVENT) 0.06 % nasal spray    Sig: Place 2 sprays into both nostrils 4 (four) times daily.    Dispense:  15 mL    Refill:  3   I have spent a total time of 33-minutes on the day of the appointment reviewing prior documentation, coordinating care and discussing medical diagnosis and plan with the patient/family. Past medical history, allergies, medications were reviewed. Pertinent imaging, labs and tests  included in this note have been reviewed and interpreted independently by me.  Deral Schellenberg Mechele Collin, MD [facility] 12/06/2020 4:12 PM

## 2020-12-10 ENCOUNTER — Encounter: Payer: Self-pay | Admitting: Physical Therapy

## 2020-12-10 ENCOUNTER — Other Ambulatory Visit: Payer: Self-pay

## 2020-12-10 ENCOUNTER — Ambulatory Visit: Payer: BC Managed Care – PPO | Attending: Podiatry | Admitting: Physical Therapy

## 2020-12-10 DIAGNOSIS — M6281 Muscle weakness (generalized): Secondary | ICD-10-CM | POA: Diagnosis not present

## 2020-12-10 DIAGNOSIS — M25571 Pain in right ankle and joints of right foot: Secondary | ICD-10-CM | POA: Insufficient documentation

## 2020-12-10 DIAGNOSIS — R2689 Other abnormalities of gait and mobility: Secondary | ICD-10-CM | POA: Diagnosis not present

## 2020-12-10 NOTE — Therapy (Signed)
[facility] Outpatient Rehabilitation [facility] [address] [place], [place], [address] Phone: [phone]   Fax:  [phone]  Physical Therapy Treatment  Patient Details  Name: Wayne Forbes MRN: [id number] Date of Birth: 06-19-1960 Referring Provider (PT): Park Liter, DPM   Encounter Date: 12/10/2020   PT End of Session - 12/10/20 1405     Visit Number 2    Number of Visits 16    Date for PT Re-Evaluation 01/30/21    Authorization Type BCBS    PT Start Time 1400    PT Stop Time 1445    PT Time Calculation (min) 45 min             Past Medical History:  Diagnosis Date   Alcohol use    Apnea, sleep    does not wear cpap all time   Calculus of kidney    Constipation    Cough    COVID-19    DDD (degenerative disc disease), lumbar    Elevated ferritin    Elevated liver function tests    Endothelial corneal dystrophy, unspecified eye    Fatty liver 2019   Fuchs' corneal dystrophy    High cholesterol    Insomnia    Kidney stone    Kidney stones    LV dysfunction    OSA (obstructive sleep apnea)    Shingles    Sinusitis    Spondylolisthesis of lumbar region    Steatohepatitis     Past Surgical History:  Procedure Laterality Date   EXTRACORPOREAL SHOCK WAVE LITHOTRIPSY Right 05/26/2018   Procedure: EXTRACORPOREAL SHOCK WAVE LITHOTRIPSY (ESWL);  Surgeon: [name], MD;  Location: [facility];  Service: Urology;  Laterality: Right;   EXTRACORPOREAL SHOCK WAVE LITHOTRIPSY Right 05/30/2018   Procedure: EXTRACORPOREAL SHOCK WAVE LITHOTRIPSY (ESWL);  Surgeon: [name], MD;  Location: [facility];  Service: Urology;  Laterality: Right;   TENDON REPAIR Right    arm    There were no vitals filed for this visit.   Subjective Assessment - 12/10/20 1408     Subjective Pt arrives with 3/10 pain with walking.               [facility] Adult PT Treatment/Exercise - 12/10/20 0001       Ambulation/Gait   Ambulation/Gait Yes     Ambulation Distance (Feet) 100 Feet    Gait Comments Cues for heel strike and step length      Ankle Exercises: Stretches   Gastroc Stretch Limitations seated with strap DF x 3      Ankle Exercises: Aerobic   Recumbent Bike Rec bike L3 x 4 minutes      Ankle Exercises: Seated   ABC's 1 rep    ABC's Limitations supine feet on bolster    Towel Crunch 5 reps    Towel Crunch Limitations 5 sets    Towel Inversion/Eversion 5 reps    Towel Inversion/Eversion Weights (lbs) 0    Towel Inversion/Eversion Limitations 5 sets    Heel Raises 20 reps    Toe Raise 20 reps    Other Seated Ankle Exercises Toe Yoga                       PT Short Term Goals - 12/05/20 1820       PT SHORT TERM GOAL #1   Title Wayne Forbes will be >75% HEP compliant to improve carryover between sessions and facilitate independent management of condition  Target Date 01/02/21               PT Long Term Goals - 12/05/20 1820       PT LONG TERM GOAL #1   Title Wayne Forbes will achieve greater than 40 degrees of closed chain right ankle DF of the affected ankle (inclinometer placed just distal to tibial tuberosity; "zeroed" vertically; 1/2 kneeling if able; measured on forward leg  ideal is between 40 - 50 degrees) to normalize terminal stance phase of gait and reduce stress on posterior ankle structures  EVAL: 3 degrees non weight bearing  target date: 01/30/21      PT LONG TERM GOAL #2   Title Wayne Forbes will be able to stand for >40'' in SLS stance, to show a significant improvement in balance in order to reduce fall risk  EVAL: unable  target date: 01/30/21      PT LONG TERM GOAL #3   Title Wayne Forbes will improve FOTO score from 43 (on evaluation) to 63 as a proxy for functional improvement  target date: 01/30/21      PT LONG TERM GOAL #4   Title Wayne Forbes will report >/= 50% decrease in pain from evaluation  EVAL: 6/10 max pain   target date: 01/30/21      PT LONG TERM GOAL #5   Title Wayne Forbes will be able to return to work, not limited by pain  EVAL: limited by pain  target date: 01/30/21                   Plan - 12/10/20 1410     Clinical Impression Statement Pt saw MD who issued ASO and told him to wear sneakers. He arrives carrying ASO and wearing sneakers, antalgic gait pattern. Began Rec bike which increased his pain at first and then pain reduced after 3 minutes. Reviewed HEP and progrssed with ankle ROM in supine and sitting. Instructed pt in heel stike and step length for gait training. Vaso at end of session to decrease edema and soreness.    PT Treatment/Interventions ADLs/Self Care Home Management;Iontophoresis 4mg /ml Dexamethasone;Electrical Stimulation;Therapeutic activities;Gait training;Therapeutic exercise;Neuromuscular re-education;Manual techniques;Dry needling;Vasopneumatic Device;Joint Manipulations    PT Next Visit Plan assess HEP and w/b status after MD visit.  Progressive W/B and AROM.  Strengthening as appropriate.    PT Home Exercise Plan             Patient will benefit from skilled therapeutic intervention in order to improve the following deficits and impairments:  Abnormal gait, Decreased balance, Pain, Decreased strength, Decreased range of motion  Visit Diagnosis: Pain in right ankle and joints of right foot  Balance problem  Muscle weakness  Other abnormalities of gait and mobility     Problem List Patient Active Problem List   Diagnosis Date Noted   Restrictive lung disease 12/06/2020   Acquired spondylolisthesis 07/09/2020   Alcohol abuse 07/09/2020   Chronic sinusitis 07/09/2020   Constipation 07/09/2020   Cough variant asthma 07/09/2020   Degeneration of lumbar intervertebral disc 07/09/2020   Elevated ferritin 07/09/2020   Endothelial corneal dystrophy, unspecified eye 07/09/2020   Heart disease 07/09/2020   Primary insomnia  07/09/2020   Fatty liver 07/09/2020   Unspecified abnormal finding in specimens from other organs, systems and tissues 07/09/2020   Kidney stone 07/31/2019   Moderate obstructive sleep apnea 06/07/2018   TMJ dysfunction 11/26/2015    11/28/2015, PTA 12/10/2020, 2:42 PM  [facility] Outpatient Rehabilitation [facility] [address] [place], [place], [address] Phone: [phone]   Fax:  [phone]  Name: Wayne Forbes MRN: [id number] Date of Birth: 03-Sep-1960

## 2020-12-12 ENCOUNTER — Telehealth: Payer: Self-pay | Admitting: Podiatry

## 2020-12-12 MED ORDER — OXYCODONE HCL 15 MG PO TABS: 15.0000 mg | ORAL_TABLET | ORAL | 0 refills | Status: DC | PRN

## 2020-12-12 NOTE — Addendum Note (Signed)
Addended by: Ventura Sellers on: 12/12/2020 05:10 PM   Modules accepted: Orders

## 2020-12-12 NOTE — Telephone Encounter (Signed)
Patient is requesting a refill of his pain medication.  Thanks 

## 2020-12-13 ENCOUNTER — Encounter: Payer: Self-pay | Admitting: Physical Therapy

## 2020-12-13 ENCOUNTER — Ambulatory Visit: Payer: BC Managed Care – PPO | Admitting: Physical Therapy

## 2020-12-13 ENCOUNTER — Other Ambulatory Visit: Payer: Self-pay

## 2020-12-13 DIAGNOSIS — M25571 Pain in right ankle and joints of right foot: Secondary | ICD-10-CM

## 2020-12-13 DIAGNOSIS — R2689 Other abnormalities of gait and mobility: Secondary | ICD-10-CM

## 2020-12-13 DIAGNOSIS — M6281 Muscle weakness (generalized): Secondary | ICD-10-CM | POA: Diagnosis not present

## 2020-12-13 NOTE — Therapy (Signed)
[facility] Outpatient Rehabilitation [facility] [address] [place], [place], [address] Phone: [phone]   Fax:  [phone]  Physical Therapy Treatment  Patient Details  Name: Wayne Forbes MRN: [id number] Date of Birth: Apr 21, 1960 Referring Provider (PT): Park Liter, DPM   Encounter Date: 12/13/2020   PT End of Session - 12/13/20 1235     Visit Number 3    Number of Visits 16    Date for PT Re-Evaluation 01/30/21    Authorization Type BCBS    PT Start Time 1231    PT Stop Time 1310    PT Time Calculation (min) 39 min             Past Medical History:  Diagnosis Date   Alcohol use    Apnea, sleep    does not wear cpap all time   Calculus of kidney    Constipation    Cough    COVID-19    DDD (degenerative disc disease), lumbar    Elevated ferritin    Elevated liver function tests    Endothelial corneal dystrophy, unspecified eye    Fatty liver 2019   Fuchs' corneal dystrophy    High cholesterol    Insomnia    Kidney stone    Kidney stones    LV dysfunction    OSA (obstructive sleep apnea)    Shingles    Sinusitis    Spondylolisthesis of lumbar region    Steatohepatitis     Past Surgical History:  Procedure Laterality Date   EXTRACORPOREAL SHOCK WAVE LITHOTRIPSY Right 05/26/2018   Procedure: EXTRACORPOREAL SHOCK WAVE LITHOTRIPSY (ESWL);  Surgeon: [name], MD;  [facility]: [facility];  Service: Urology;  Laterality: Right;   EXTRACORPOREAL SHOCK WAVE LITHOTRIPSY Right 05/30/2018   Procedure: EXTRACORPOREAL SHOCK WAVE LITHOTRIPSY (ESWL);  Surgeon: [name], MD;  Location: [facility];  Service: Urology;  Laterality: Right;   TENDON REPAIR Right    arm    There were no vitals filed for this visit.   Subjective Assessment - 12/13/20 1233     Subjective Pt arrives with 1/10 pain and 3/10 with walking.  I have a hard time with the heel strike.    Currently in Pain? Yes    Pain Score 3     Pain Location Ankle    Pain  Orientation Right    Pain Descriptors / Indicators Aching    Pain Type Chronic pain    Aggravating Factors  walking    Pain Relieving Factors elevation                 [facility] Adult PT Treatment/Exercise - 12/13/20 0001       Ambulation/Gait   Ambulation/Gait Yes    Ambulation Distance (Feet) 100 Feet    Gait Comments Cues for heel strike and step length      Ankle Exercises: Aerobic   Recumbent Bike Rec bike L3 x 4 minutes      Ankle Exercises: Stretches   Theme park manager Limitations Runners stretch 3 x 30 sec each      Ankle Exercises: Seated   ABC's 1 rep    ABC's Limitations supine feet on bolster    Towel Crunch 5 reps    Towel Inversion/Eversion 5 reps    Towel Inversion/Eversion Weights (lbs) 0    Towel Inversion/Eversion Limitations 5 sets   3 reps each   Heel Raises 20 reps   5# on knee   Toe Raise 20 reps  2 1/2 lb ankle weight around foot   BAPS Level 2   CW / CCW x 10 each   Other Seated Ankle Exercises Toe Yoga      Additional Ankle Exercises DO NOT USE   Towel Crunch Limitations --      Ankle Exercises: Standing   Other Standing Ankle Exercises Blue rocker board A/P without UE 40 reps                       PT Short Term Goals - 12/05/20 1820       PT SHORT TERM GOAL #1   Title Wayne Forbes will be >75% HEP compliant to improve carryover between sessions and facilitate independent management of condition    Target Date 01/02/21               PT Long Term Goals - 12/05/20 1820       PT LONG TERM GOAL #1   Title Wayne Forbes will achieve greater than 40 degrees of closed chain right ankle DF of the affected ankle (inclinometer placed just distal to tibial tuberosity; "zeroed" vertically; 1/2 kneeling if able; measured on forward leg  ideal is between 40 - 50 degrees) to normalize terminal stance phase of gait and reduce stress on posterior ankle structures  EVAL: 3 degrees non weight bearing  target date:  01/30/21      PT LONG TERM GOAL #2   Title Wayne Forbes will be able to stand for >40'' in SLS stance, to show a significant improvement in balance in order to reduce fall risk  EVAL: unable  target date: 01/30/21      PT LONG TERM GOAL #3   Title Wayne Forbes will improve FOTO score from 43 (on evaluation) to 63 as a proxy for functional improvement  target date: 01/30/21      PT LONG TERM GOAL #4   Title Wayne Forbes will report >/= 50% decrease in pain from evaluation  EVAL: 6/10 max pain  target date: 01/30/21      PT LONG TERM GOAL #5   Title Wayne Forbes will be able to return to work, not limited by pain  EVAL: limited by pain  target date: 01/30/21                   Plan - 12/13/20 1317     Clinical Impression Statement Pt reports soreness next moring after last session. He thought Vaso was helpful but too cold. Today we progressed with standing rocker board and seated baps board for ankle ROM, strength and propriception. Began standing gastroc stretch. He tolerated all therex with min discomfort in ankle. Revisted gait training which he reports is difficult to maintain consistent heelstrike but can demonstrate in clinc correctly. He declined modality at end of session. Will assess response and update HEP/ progress as tolerated.    PT Next Visit Plan assess HEP and w/b status after MD visit.  Progressive W/B and AROM.  Strengthening as appropriate.    PT Home Exercise Plan [id number]    Consulted and Agree with Plan of Care Patient             Patient will benefit from skilled therapeutic intervention in order to improve the following deficits and impairments:  Abnormal gait, Decreased balance, Pain, Decreased strength, Decreased range of motion  Visit Diagnosis: Pain in right ankle and joints of right foot  Other abnormalities of  gait and mobility  Muscle weakness  Balance problem     Problem List Patient Active  Problem List   Diagnosis Date Noted   Restrictive lung disease 12/06/2020   Acquired spondylolisthesis 07/09/2020   Alcohol abuse 07/09/2020   Chronic sinusitis 07/09/2020   Constipation 07/09/2020   Cough variant asthma 07/09/2020   Degeneration of lumbar intervertebral disc 07/09/2020   Elevated ferritin 07/09/2020   Endothelial corneal dystrophy, unspecified eye 07/09/2020   Heart disease 07/09/2020   Primary insomnia 07/09/2020   Fatty liver 07/09/2020   Unspecified abnormal finding in specimens from other organs, systems and tissues 07/09/2020   Kidney stone 07/31/2019   Moderate obstructive sleep apnea 06/07/2018   TMJ dysfunction 11/26/2015    Sherrie Mustache, PTA 12/13/2020, 1:20 PM  [facility] [address] [place], [place], [address] Phone: [phone]   Fax:  [phone]  Name: Wayne Forbes MRN: [id number] Date of Birth: 1961-01-05

## 2020-12-15 DIAGNOSIS — G4733 Obstructive sleep apnea (adult) (pediatric): Secondary | ICD-10-CM | POA: Diagnosis not present

## 2020-12-17 ENCOUNTER — Encounter: Payer: BC Managed Care – PPO | Admitting: Physical Therapy

## 2020-12-20 ENCOUNTER — Telehealth: Payer: Self-pay | Admitting: Podiatry

## 2020-12-20 ENCOUNTER — Ambulatory Visit: Payer: BC Managed Care – PPO | Admitting: Physical Therapy

## 2020-12-20 NOTE — Telephone Encounter (Signed)
Yes can we write the note for him

## 2020-12-20 NOTE — Telephone Encounter (Signed)
Pt would like permission to return to work on 10/24. Please advise? Thanks

## 2020-12-23 ENCOUNTER — Encounter: Payer: Self-pay | Admitting: Podiatry

## 2020-12-24 ENCOUNTER — Encounter: Payer: Self-pay | Admitting: Podiatry

## 2020-12-25 ENCOUNTER — Ambulatory Visit: Payer: BC Managed Care – PPO | Admitting: Physical Therapy

## 2020-12-25 ENCOUNTER — Other Ambulatory Visit: Payer: Self-pay

## 2020-12-25 ENCOUNTER — Encounter: Payer: Self-pay | Admitting: Physical Therapy

## 2020-12-25 DIAGNOSIS — M25571 Pain in right ankle and joints of right foot: Secondary | ICD-10-CM

## 2020-12-25 DIAGNOSIS — M6281 Muscle weakness (generalized): Secondary | ICD-10-CM | POA: Diagnosis not present

## 2020-12-25 DIAGNOSIS — R2689 Other abnormalities of gait and mobility: Secondary | ICD-10-CM | POA: Diagnosis not present

## 2020-12-25 NOTE — Patient Instructions (Signed)
Access Code: [email or web address] URL: [email or web address] Date: 12/25/2020 Prepared by: Alphonzo Severance  Exercises Gastroc Stretch with Foot at Wall - 2 x daily - 7 x weekly - 3 reps - 45 second hold Standing Bilateral Heel Raise on Step - 1 x daily - 7 x weekly - 3 sets - 10 reps Tandem Stance - 1 x daily - 7 x weekly - 1 sets - 4 reps - 45'' hold  Patient Education Rubbing with Different Textures

## 2020-12-25 NOTE — Therapy (Addendum)
[facility] Outpatient Rehabilitation [facility] [address] [place], [place], [address] Phone: [phone]   Fax:  [phone]  PHYSICAL THERAPY UNPLANNED DISCHARGE SUMMARY   Visits from Start of Care: 4  Current functional level related to goals / functional outcomes: Current status unknown   Remaining deficits: Current status unknown   Education / Equipment: Pt has not returned since visit listed below  Patient goals were not assessed. Patient is being discharged due to not returning since the last visit.  (the below note was addended to include the above D/C summary on 02/07/21)   Physical Therapy Treatment  Patient Details  Name: Wayne Forbes MRN: [id number] Date of Birth: 03/04/1961 Referring Provider (PT): Park Liter, DPM   Encounter Date: 12/25/2020   PT End of Session - 12/25/20 1302     Visit Number 4    Number of Visits 16    Date for PT Re-Evaluation 01/30/21    Authorization Type BCBS    PT Start Time 1300    PT Stop Time 1343    PT Time Calculation (min) 43 min             Past Medical History:  Diagnosis Date   Alcohol use    Apnea, sleep    does not wear cpap all time   Calculus of kidney    Constipation    Cough    COVID-19    DDD (degenerative disc disease), lumbar    Elevated ferritin    Elevated liver function tests    Endothelial corneal dystrophy, unspecified eye    Fatty liver 2019   Fuchs' corneal dystrophy    High cholesterol    Insomnia    Kidney stone    Kidney stones    LV dysfunction    OSA (obstructive sleep apnea)    Shingles    Sinusitis    Spondylolisthesis of lumbar region    Steatohepatitis     Past Surgical History:  Procedure Laterality Date   EXTRACORPOREAL SHOCK WAVE LITHOTRIPSY Right 05/26/2018   Procedure: EXTRACORPOREAL SHOCK WAVE LITHOTRIPSY (ESWL);  Surgeon: [name], MD;  [facility]: [facility];  Service: Urology;  Laterality: Right;   EXTRACORPOREAL SHOCK WAVE  LITHOTRIPSY Right 05/30/2018   Procedure: EXTRACORPOREAL SHOCK WAVE LITHOTRIPSY (ESWL);  Surgeon: [name], MD;  Location: [facility];  Service: Urology;  Laterality: Right;   TENDON REPAIR Right    arm    There were no vitals filed for this visit.   Subjective Assessment - 12/25/20 1323     Subjective Pt arrives to clinic with no current pain complaint.  He does have discomfort over his incision.                [facility] PT Assessment - 12/25/20 0001       Observation/Other Assessments   Focus on Therapeutic Outcomes (FOTO)  58      AROM   Right Ankle Dorsiflexion 33   cc   Left Ankle Dorsiflexion 37   cc            [facility] Adult PT Treatment/Exercise:  Therapeutic Exercise: - bike - L3 5 min - while taking subjective - bil heel raise and lower on box - slant board stretch - 45'' x3  Therapeutic Activity - collecting information for goals, checking progress, and reviewing with patient; reviewing plan moving forward to cut down to 1 time every 2 weeks  Neuromuscular re-ed, to improve balance and reduce fall risk: - tandem  on foam - 30''x3 - SLS stopped d/t pain - rocker board DF/PF - 20x ea - rocker board DF/PF - 20x ea - unilateral w/ uni UE support     PT Short Term Goals - 12/25/20 1319       PT SHORT TERM GOAL #1   Title Wayne Forbes will be >75% HEP compliant to improve carryover between sessions and facilitate independent management of condition    Status Achieved    Target Date 01/02/21               PT Long Term Goals - 12/25/20 1318       PT LONG TERM GOAL #1   Title Wayne Forbes will achieve greater than 40 degrees of closed chain right ankle DF of the affected ankle (inclinometer placed just distal to tibial tuberosity; "zeroed" vertically; 1/2 kneeling if able; measured on forward leg  ideal is between 40 - 50 degrees) to normalize terminal stance phase of gait and reduce stress on posterior ankle structures  EVAL: 3  degrees non weight bearing  target date: 01/30/21    Baseline 10/19: L 37, R 33    Status On-going      PT LONG TERM GOAL #2   Title Wayne Forbes will be able to stand for >40'' in SLS stance, to show a significant improvement in balance in order to reduce fall risk  EVAL: unable  target date: 01/30/21      PT LONG TERM GOAL #3   Title Wayne Forbes will improve FOTO score from 43 (on evaluation) to 63 as a proxy for functional improvement  target date: 01/30/21    Baseline 10/19: 58    Status On-going      PT LONG TERM GOAL #4   Title Wayne Forbes will report >/= 50% decrease in pain from evaluation  EVAL: 6/10 max pain  target date: 01/30/21      PT LONG TERM GOAL #5   Title Wayne Forbes will be able to return to work, not limited by pain  EVAL: limited by pain  target date: 01/30/21                   Plan - 12/25/20 1346     Clinical Impression Statement Pt reports no increase in baseline pain following therapy  HEP was updated and reissued to patient; pt educated on HEP, was provided handout, and verbally confirmed understanding of exercises.    Overall, Wayne Forbes is progressing well with therapy.  Today we concentrated on ankle strengthening and ankle range of motion.  Pt shows some limitation in R vs L DF which will be addressed by updated HEP.  Will begin more aggressive strengthening program.  Pt will continue to benefit from skilled physical therapy to address remaining deficits and achieve listed goals.  Continue per POC.    PT Next Visit Plan assess HEP and w/b status after MD visit.  Progressive W/B and AROM.  Strengthening as appropriate.    PT Home Exercise Plan [id number]    Consulted and Agree with Plan of Care Patient             Patient will benefit from skilled therapeutic intervention in order to improve the following deficits and impairments:  Abnormal gait, Decreased balance, Pain, Decreased  strength, Decreased range of motion  Visit Diagnosis: Pain in right ankle and joints of right foot  Other abnormalities of gait and mobility  Muscle  weakness  Balance problem     Problem List Patient Active Problem List   Diagnosis Date Noted   Restrictive lung disease 12/06/2020   Acquired spondylolisthesis 07/09/2020   Alcohol abuse 07/09/2020   Chronic sinusitis 07/09/2020   Constipation 07/09/2020   Cough variant asthma 07/09/2020   Degeneration of lumbar intervertebral disc 07/09/2020   Elevated ferritin 07/09/2020   Endothelial corneal dystrophy, unspecified eye 07/09/2020   Heart disease 07/09/2020   Primary insomnia 07/09/2020   Fatty liver 07/09/2020   Unspecified abnormal finding in specimens from other organs, systems and tissues 07/09/2020   Kidney stone 07/31/2019   Moderate obstructive sleep apnea 06/07/2018   TMJ dysfunction 11/26/2015    Fredderick Phenix, PT 12/25/2020, 1:46 PM  [facility] Outpatient Rehabilitation [facility] [address] [place], [place], [address] Phone: [phone]   Fax:  [phone]  Name: Wayne Forbes MRN: [id number] Date of Birth: 10-Nov-1960

## 2020-12-27 DIAGNOSIS — R7989 Other specified abnormal findings of blood chemistry: Secondary | ICD-10-CM | POA: Diagnosis not present

## 2020-12-31 ENCOUNTER — Ambulatory Visit (INDEPENDENT_AMBULATORY_CARE_PROVIDER_SITE_OTHER): Payer: BC Managed Care – PPO | Admitting: Podiatry

## 2020-12-31 ENCOUNTER — Other Ambulatory Visit: Payer: Self-pay

## 2020-12-31 DIAGNOSIS — M7671 Peroneal tendinitis, right leg: Secondary | ICD-10-CM

## 2020-12-31 DIAGNOSIS — Z9889 Other specified postprocedural states: Secondary | ICD-10-CM

## 2020-12-31 NOTE — Progress Notes (Signed)
  Subjective:  Patient ID: Wayne Forbes, male    DOB: 1960/03/25,  MRN: [id number]  No chief complaint on file.  DOS: 10/09/20 Procedure: Repair of peroneal tendon, removal of os peroneum, 5th met exostectomy  60 y.o. male presents with the above complaint. History confirmed with patient. Only having a little burning but otherwise doing very well.  Objective:  Physical Exam: tenderness at the surgical site, local edema noted, and calf supple, nontender. Incision: Well-healed Assessment:   1. Peroneal tendonitis of right lower leg   2. Post-operative state    Plan:  Patient was evaluated and treated and all questions answered.  Post-operative State -Small residual scab noted. Debrided,ointment applied -Continue normal shoegear. No restrictions.  -Pleased with results of surgery. F/u should issues persist. -XRs needed at follow-up: none  No follow-ups on file.

## 2021-01-06 ENCOUNTER — Telehealth: Payer: Self-pay | Admitting: Podiatry

## 2021-01-06 MED ORDER — OXYCODONE HCL 15 MG PO TABS: 15.0000 mg | ORAL_TABLET | ORAL | 0 refills | Status: AC | PRN

## 2021-01-06 NOTE — Telephone Encounter (Signed)
Pt called office to request pain prescription refill. States he spoke with pain management first, but they denied request. Thanks

## 2021-01-06 NOTE — Telephone Encounter (Signed)
Pt notified/reb °

## 2021-01-06 NOTE — Addendum Note (Signed)
Addended by: Ventura Sellers on: 01/06/2021 03:37 PM   Modules accepted: Orders

## 2021-01-09 ENCOUNTER — Telehealth: Payer: Self-pay | Admitting: Podiatry

## 2021-01-09 ENCOUNTER — Encounter: Payer: BC Managed Care – PPO | Admitting: Physical Therapy

## 2021-01-09 NOTE — Telephone Encounter (Signed)
Patient went to [facility] and stated that they needed to speak with Dr. Samuella Cota before refilling pain medication. Please call [facility] asap patient has ran out of meds.

## 2021-01-09 NOTE — Telephone Encounter (Signed)
Spoke to pharmacist should be ok for him to pick up his medication now please inform

## 2021-01-13 ENCOUNTER — Telehealth: Payer: Self-pay | Admitting: *Deleted

## 2021-01-13 NOTE — Telephone Encounter (Signed)
Called patient and he has picked up his pain medicine prescription.

## 2021-01-15 DIAGNOSIS — K5903 Drug induced constipation: Secondary | ICD-10-CM | POA: Diagnosis not present

## 2021-01-15 DIAGNOSIS — Z79891 Long term (current) use of opiate analgesic: Secondary | ICD-10-CM | POA: Diagnosis not present

## 2021-01-15 DIAGNOSIS — G4733 Obstructive sleep apnea (adult) (pediatric): Secondary | ICD-10-CM | POA: Diagnosis not present

## 2021-01-15 DIAGNOSIS — M47816 Spondylosis without myelopathy or radiculopathy, lumbar region: Secondary | ICD-10-CM | POA: Diagnosis not present

## 2021-01-15 DIAGNOSIS — G894 Chronic pain syndrome: Secondary | ICD-10-CM | POA: Diagnosis not present

## 2021-01-15 DIAGNOSIS — G4700 Insomnia, unspecified: Secondary | ICD-10-CM | POA: Diagnosis not present

## 2021-01-24 DIAGNOSIS — K7460 Unspecified cirrhosis of liver: Secondary | ICD-10-CM | POA: Diagnosis not present

## 2021-01-24 DIAGNOSIS — K7581 Nonalcoholic steatohepatitis (NASH): Secondary | ICD-10-CM | POA: Diagnosis not present

## 2021-01-27 ENCOUNTER — Telehealth: Payer: Self-pay | Admitting: Podiatry

## 2021-01-27 NOTE — Telephone Encounter (Signed)
Pt called requesting refill on oxyCODONE (ROXICODONE) 15 MG immediate release tablet. He's requesting it to be sent to [facility]. Please advise.

## 2021-02-10 DIAGNOSIS — U071 COVID-19: Secondary | ICD-10-CM | POA: Diagnosis not present

## 2021-02-17 DIAGNOSIS — Z20822 Contact with and (suspected) exposure to covid-19: Secondary | ICD-10-CM | POA: Diagnosis not present

## 2021-03-11 ENCOUNTER — Telehealth: Payer: Self-pay | Admitting: Podiatry

## 2021-03-11 NOTE — Telephone Encounter (Signed)
Pt called requesting pain medication. He states he is experiencing serious foot pain. I was able to make him an appt for this Friday, 1/6. He would like to know if you can call something in that'll hold him over until you see him. Please advise.

## 2021-03-14 ENCOUNTER — Ambulatory Visit (INDEPENDENT_AMBULATORY_CARE_PROVIDER_SITE_OTHER): Payer: Self-pay | Admitting: Podiatry

## 2021-03-14 ENCOUNTER — Other Ambulatory Visit: Payer: Self-pay

## 2021-03-14 DIAGNOSIS — G894 Chronic pain syndrome: Secondary | ICD-10-CM | POA: Diagnosis not present

## 2021-03-14 DIAGNOSIS — M47816 Spondylosis without myelopathy or radiculopathy, lumbar region: Secondary | ICD-10-CM | POA: Diagnosis not present

## 2021-03-14 DIAGNOSIS — K5903 Drug induced constipation: Secondary | ICD-10-CM | POA: Diagnosis not present

## 2021-03-14 DIAGNOSIS — Z91199 Patient's noncompliance with other medical treatment and regimen due to unspecified reason: Secondary | ICD-10-CM

## 2021-03-14 DIAGNOSIS — G4700 Insomnia, unspecified: Secondary | ICD-10-CM | POA: Diagnosis not present

## 2021-03-14 NOTE — Progress Notes (Signed)
   Complete physical exam  Patient: Wayne Forbes   DOB: 12/27/1997   61 y.o. Male  MRN: [id number]  Subjective:    No chief complaint on file.   Wayne Forbes is a 61 y.o. male who presents today for a complete physical exam. She reports consuming a {diet types:17450} diet. {types:19826} She generally feels {DESC; WELL/FAIRLY WELL/POORLY:18703}. She reports sleeping {DESC; WELL/FAIRLY WELL/POORLY:18703}. She {does/does not:200015} have additional problems to discuss today.    Most recent fall risk assessment:    09/03/2021   10:42 AM  Fall Risk   Falls in the past year? 0  Number falls in past yr: 0  Injury with Fall? 0  Risk for fall due to : No Fall Risks  Follow up Falls evaluation completed     Most recent depression screenings:    09/03/2021   10:42 AM 07/25/2020   10:46 AM  PHQ 2/9 Scores  PHQ - 2 Score 0 0  PHQ- 9 Score 5     {VISON DENTAL STD PSA (Optional):27386}  {History (Optional):23778}  Patient Care Team: Jessup, Joy, NP as PCP - General (Nurse Practitioner)   Outpatient Medications Prior to Visit  Medication Sig   fluticasone (FLONASE) 50 MCG/ACT nasal spray Place 2 sprays into both nostrils in the morning and at bedtime. After 7 days, reduce to once daily.   norgestimate-ethinyl estradiol (SPRINTEC 28) 0.25-35 MG-MCG tablet Take 1 tablet by mouth daily.   Nystatin POWD Apply liberally to affected area 2 times per day   spironolactone (ALDACTONE) 100 MG tablet Take 1 tablet (100 mg total) by mouth daily.   No facility-administered medications prior to visit.    ROS        Objective:     There were no vitals taken for this visit. {Vitals History (Optional):23777}  Physical Exam   No results found for any visits on 10/09/21. {Show previous labs (optional):23779}    Assessment & Plan:    Routine Health Maintenance and Physical Exam  Immunization History  Administered Date(s) Administered   DTaP 03/12/1999, 05/08/1999,  07/17/1999, 04/01/2000, 10/16/2003   Hepatitis A 08/12/2007, 08/17/2008   Hepatitis B 12/28/1998, 02/04/1999, 07/17/1999   HiB (PRP-OMP) 03/12/1999, 05/08/1999, 07/17/1999, 04/01/2000   IPV 03/12/1999, 05/08/1999, 01/05/2000, 10/16/2003   Influenza,inj,Quad PF,6+ Mos 11/17/2013   Influenza-Unspecified 02/17/2012   MMR 01/04/2001, 10/16/2003   Meningococcal Polysaccharide 08/17/2011   Pneumococcal Conjugate-13 04/01/2000   Pneumococcal-Unspecified 07/17/1999, 09/30/1999   Tdap 08/17/2011   Varicella 01/05/2000, 08/12/2007    Health Maintenance  Topic Date Due   HIV Screening  Never done   Hepatitis C Screening  Never done   INFLUENZA VACCINE  10/07/2021   PAP-Cervical Cytology Screening  10/09/2021 (Originally 12/27/2019)   PAP SMEAR-Modifier  10/09/2021 (Originally 12/27/2019)   TETANUS/TDAP  10/09/2021 (Originally 08/16/2021)   HPV VACCINES  Discontinued   COVID-19 Vaccine  Discontinued    Discussed health benefits of physical activity, and encouraged her to engage in regular exercise appropriate for her age and condition.  Problem List Items Addressed This Visit   None Visit Diagnoses     Annual physical exam    -  Primary   Cervical cancer screening       Need for Tdap vaccination          No follow-ups on file.     Joy Jessup, NP   

## 2021-03-31 DIAGNOSIS — E785 Hyperlipidemia, unspecified: Secondary | ICD-10-CM | POA: Diagnosis not present

## 2021-03-31 DIAGNOSIS — K7400 Hepatic fibrosis, unspecified: Secondary | ICD-10-CM | POA: Diagnosis not present

## 2021-03-31 DIAGNOSIS — K7581 Nonalcoholic steatohepatitis (NASH): Secondary | ICD-10-CM | POA: Diagnosis not present

## 2021-03-31 DIAGNOSIS — R7989 Other specified abnormal findings of blood chemistry: Secondary | ICD-10-CM | POA: Diagnosis not present

## 2021-03-31 DIAGNOSIS — K7460 Unspecified cirrhosis of liver: Secondary | ICD-10-CM | POA: Diagnosis not present

## 2021-03-31 DIAGNOSIS — K760 Fatty (change of) liver, not elsewhere classified: Secondary | ICD-10-CM | POA: Diagnosis not present

## 2021-03-31 DIAGNOSIS — G4733 Obstructive sleep apnea (adult) (pediatric): Secondary | ICD-10-CM | POA: Diagnosis not present

## 2021-04-04 DIAGNOSIS — G4733 Obstructive sleep apnea (adult) (pediatric): Secondary | ICD-10-CM | POA: Diagnosis not present

## 2021-04-04 DIAGNOSIS — R739 Hyperglycemia, unspecified: Secondary | ICD-10-CM | POA: Diagnosis not present

## 2021-04-04 DIAGNOSIS — M5136 Other intervertebral disc degeneration, lumbar region: Secondary | ICD-10-CM | POA: Diagnosis not present

## 2021-04-04 DIAGNOSIS — K7581 Nonalcoholic steatohepatitis (NASH): Secondary | ICD-10-CM | POA: Diagnosis not present

## 2021-04-04 DIAGNOSIS — Z23 Encounter for immunization: Secondary | ICD-10-CM | POA: Diagnosis not present

## 2021-04-04 DIAGNOSIS — Z0000 Encounter for general adult medical examination without abnormal findings: Secondary | ICD-10-CM | POA: Diagnosis not present

## 2021-04-04 DIAGNOSIS — R030 Elevated blood-pressure reading, without diagnosis of hypertension: Secondary | ICD-10-CM | POA: Diagnosis not present

## 2021-04-04 DIAGNOSIS — M4316 Spondylolisthesis, lumbar region: Secondary | ICD-10-CM | POA: Diagnosis not present

## 2021-04-06 DIAGNOSIS — Z20822 Contact with and (suspected) exposure to covid-19: Secondary | ICD-10-CM | POA: Diagnosis not present

## 2021-04-07 DIAGNOSIS — Z03818 Encounter for observation for suspected exposure to other biological agents ruled out: Secondary | ICD-10-CM | POA: Diagnosis not present

## 2021-04-07 DIAGNOSIS — R059 Cough, unspecified: Secondary | ICD-10-CM | POA: Diagnosis not present

## 2021-04-08 DIAGNOSIS — J4531 Mild persistent asthma with (acute) exacerbation: Secondary | ICD-10-CM | POA: Diagnosis not present

## 2021-04-08 DIAGNOSIS — J069 Acute upper respiratory infection, unspecified: Secondary | ICD-10-CM | POA: Diagnosis not present

## 2021-04-11 ENCOUNTER — Ambulatory Visit: Payer: BC Managed Care – PPO | Admitting: Podiatry

## 2021-04-13 DIAGNOSIS — J069 Acute upper respiratory infection, unspecified: Secondary | ICD-10-CM | POA: Diagnosis not present

## 2021-04-13 DIAGNOSIS — J40 Bronchitis, not specified as acute or chronic: Secondary | ICD-10-CM | POA: Diagnosis not present

## 2021-05-16 DIAGNOSIS — G894 Chronic pain syndrome: Secondary | ICD-10-CM | POA: Diagnosis not present

## 2021-05-16 DIAGNOSIS — M47816 Spondylosis without myelopathy or radiculopathy, lumbar region: Secondary | ICD-10-CM | POA: Diagnosis not present

## 2021-05-16 DIAGNOSIS — G4700 Insomnia, unspecified: Secondary | ICD-10-CM | POA: Diagnosis not present

## 2021-05-16 DIAGNOSIS — K5903 Drug induced constipation: Secondary | ICD-10-CM | POA: Diagnosis not present

## 2021-06-11 ENCOUNTER — Ambulatory Visit: Payer: BC Managed Care – PPO | Admitting: Pulmonary Disease

## 2021-06-11 ENCOUNTER — Encounter: Payer: Self-pay | Admitting: Pulmonary Disease

## 2021-06-11 VITALS — BP 110/80 | HR 76 | Ht 68.0 in | Wt 217.4 lb

## 2021-06-11 DIAGNOSIS — G4733 Obstructive sleep apnea (adult) (pediatric): Secondary | ICD-10-CM

## 2021-06-11 MED ORDER — MOMETASONE FURO-FORMOTEROL FUM 200-5 MCG/ACT IN AERO: 2.0000 | INHALATION_SPRAY | Freq: Two times a day (BID) | RESPIRATORY_TRACT | 3 refills | Status: DC

## 2021-06-11 NOTE — Patient Instructions (Addendum)
Moderate Obstructive Sleep Apnea ?--Counseled on sleep hygiene ?--Counseled on weight loss/maintenance of healthy weight ?--Counseled NOT to drive if/when sleepy ?--Advised patient to wear CPAP for at least 4 hours each night for greater than 70% of the time to avoid the machine being repossessed by insurance. ?--May consider Inspire in the future. Will goal 210 lbs ? ?Shortness of breath, wheezing - resolved ?Restrictive lung defect ?Likely environmental related. No evidence of parenchymal disease on CT in 2019 ?--Due to insurance, changing from Symbicort to [facility] 200-5 mcg TWO puffs TWICE a day ? ?Follow-up with me in 6 months ?

## 2021-06-11 NOTE — Progress Notes (Signed)
? ?Synopsis: Referred in 12/2017 for chronic cough ? ?Subjective:  ? ?PATIENT ID: Wayne Forbes GENDER: male DOB: November 08, 1960, MRN: [id number] ? ? ?HPI ? ?Chief Complaint  ?Patient presents with  ? Follow-up  ?  Cpap compliance  ? ?[name] is a 61 year old male never smoker who presents for follow-up. ? ?01/28/18 ?On his last visit, he was evaluated for a 3-year history of nonproductive cough that has worsened and increased in frequency in the last year.  Cough occurs throughout the day and night.  Patient cannot identify any triggers.  He has tried [employer] and a trial of Breo and over-the-counter medications with no changes in his cough.  However after using albuterol on the day of his PFTs he noticed that he did not cough for 2 to 3 days.  Denies associated shortness of breath, sputum production or wheezing.  Denies allergies, reflux.  He is active at baseline and enjoys hunting and working out twice a week which includes cardio and weight training. ? ?12/06/20 ?For his OSA he reports wearing his CPAP nightly for at least 4 hours. Denies shortness of breath. He has noticed improvement in quality of sleep and daytime sleepiness. He is considering weight loss but not enrolled in any program at this time. He has been on short term disability after a foot injury. He has been on Symbicort twice a day and helps wheezing and shortness of breath. ? ?06/11/21 ?He reports compliance nightly with his CPAP however keeping it on all night is difficult. When compliant he reports improvement with excessive daytime sleepiness. Denies shortness of breath, wheezing or cough. Compliant with his Symbicort. He is planning to start exercising now that his foot surgery is completed. ? 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 States that environment is well ventilated.  He is required to perform pulmonary  function tests every 2 years at work which the results have been normal per patient. He is a non-smoker but reports childhood exposure to second-hand smoking.   ? ? ?Past Medical History:  ?Diagnosis Date  ? Alcohol use   ? Apnea, sleep   ? does not wear cpap all time  ? Calculus of kidney   ? Constipation   ? Cough   ? COVID-19   ? DDD (degenerative disc disease), lumbar   ? Elevated ferritin   ? Elevated liver function tests   ? Endothelial corneal dystrophy, unspecified eye   ? Fatty liver 2019  ? Fuchs' corneal dystrophy   ? High cholesterol   ? Insomnia   ? Kidney stone   ? Kidney stones   ? LV dysfunction   ? OSA (obstructive sleep apnea)   ? Shingles   ? Sinusitis   ? Spondylolisthesis of lumbar region   ? Steatohepatitis   ?  ? ?No Known Allergies  ? ?Outpatient Medications Prior to Visit  ?Medication Sig Dispense Refill  ? budesonide-formoterol (SYMBICORT) 160-4.5 MCG/ACT inhaler Inhale 1 puff into the lungs 2 (two) times daily.    ? fluticasone (FLONASE) 50 MCG/ACT nasal spray 1 spray in each nostril    ? hydrOXYzine (ATARAX) 25 MG tablet See admin instructions.    ? ipratropium (ATROVENT) 0.06 % nasal spray Place 2 sprays into both nostrils 4 (four) times daily. 15 mL 3  ? Melatonin 10 MG TABS Take by mouth.    ? Methylnaltrexone Bromide (RELISTOR PO) Take by mouth.    ?  oxyCODONE (ROXICODONE) 15 MG immediate release tablet Take 1 tablet (15 mg total) by mouth every 4 (four) hours as needed for pain. 30 tablet 0  ? Probiotic CAPS Take 1 capsule by mouth daily.    ? valACYclovir (VALTREX) 500 MG tablet Take 1,000 mg by mouth daily as needed (fever blisters).    ? albuterol (ACCUNEB) 0.63 MG/3ML nebulizer solution Inhale into the lungs. (Patient not taking: Reported on 06/11/2021)    ? cephALEXin (KEFLEX) 500 MG capsule Take 1 tablet by mouth tonight, then one tomorrow AM and one in PM 3 capsule 0  ? cyclobenzaprine (FLEXERIL) 10 MG tablet     ? escitalopram (LEXAPRO) 10 MG tablet Take 1 tablet by mouth  daily.    ? FLUoxetine (PROZAC) 20 MG capsule Take 20 mg by mouth daily.    ? metoprolol tartrate (LOPRESSOR) 100 MG tablet Take 1 tablet (100 mg total) by mouth once for 1 dose. (Patient not taking: Reported on 06/11/2021) 1 tablet 0  ? ?Facility-Administered Medications Prior to Visit  ?Medication Dose Route Frequency Provider Last Rate Last Admin  ? betamethasone acetate-betamethasone sodium phosphate (CELESTONE) injection 3 mg  3 mg Other [place] LiterPrice, Michael J, DPM      ? ? ?Review of Systems  ?Constitutional:  Negative for chills, diaphoresis, fever, malaise/fatigue and weight loss.  ?HENT:  Negative for congestion.   ?Respiratory:  Negative for cough, hemoptysis, sputum production, shortness of breath and wheezing.   ?Cardiovascular:  Negative for chest pain, palpitations and leg swelling.  ? ? ? ?Objective:  ? ? ?Vitals:  ? 06/11/21 1600  ?BP: 110/80  ?Pulse: 76  ?SpO2: 96%  ?Weight: 217 lb 6.4 oz (98.6 kg)  ?Height: 5\' 8"  (1.727 m)  ? ? ?Physical Exam: ?General: Well-appearing, no acute distress ?HENT: [place], AT ?Eyes: EOMI, no scleral icterus ?Respiratory: Clear to auscultation bilaterally.  No crackles, wheezing or rales ?Cardiovascular: RRR, -M/R/G, no JVD ?Extremities:-Edema,-tenderness ?Neuro: AAO x4, CNII-XII grossly intact ?Psych: Normal mood, normal affect ? ?Data Reviewed: ? ?Chest imaging: ?CXR 7/28 4/16-unremarkable with no evidence of acute infiltrates, effusions, edema.  Left lower lobe calcification. ?CT Chest 02/22/18 - Old granulomas ?CT Coronary 10/02/20 - No CAD. Lung parenchyma with no evidence of parenchymal abnormalities. No infiltrates, effusion or edema ? ?PFT:  ?01/17/18 - Mild restrictive defect. No obstruction or significant bronchodilator effect. Normal DLCO ? ?Sleep study: ?Home sleep study 01/17/18 - moderate obstructive sleep apnea with an AHI of 21.4 and SpO2 low of 78%. ? ?CPAP compliance 09/05/20-12/06/20 ?Days Used 48/90 (53%) ?Days >4 hours 15/90 ?Avergae use 3.6 hours nightly ?No  leak ?Pressure 9.7 mmHg ?AHI 0.3 ? ?Assessment & Plan:  ? ?Discussion: ?61 year old male never smoker with OSA on CPAP and mild restrictive lung defect who presents for follow-up. Hx of significant environmental exposure with cleaning chemicals and flame retardants. Discussed bronchodilator regimen. ? ?Moderate Obstructive Sleep Apnea ?--Will obtain CPAP compliance report for review ?--Counseled on sleep hygiene ?--Counseled on weight loss/maintenance of healthy weight ?--Counseled NOT to drive if/when sleepy ?--Advised patient to wear CPAP for at least 4 hours each night for greater than 70% of the time to avoid the machine being repossessed by insurance. ?--May consider Inspire in the future. Will goal 210 lbs ? ?Shortness of breath, wheezing ?Restrictive lung defect ?Likely environmental related. No evidence of parenchymal disease on CT in 2019 ?--[facility] to insurance, [facility] TW[facility] day. ? ?Health Maintenance ?Immunization History  ?  Administered Date(s) Administered  ? Influenza Inj Mdck Quad Pf 12/28/2017  ? Influenza Split 12/21/2016, 12/07/2017, 11/08/2019  ? Influenza,inj,Quad PF,6+ Mos 12/28/2018  ? PFIZER(Purple Top)SARS-COV-2 Vaccination 08/17/2019, 09/07/2019  ? Tdap 06/30/2011  ? ?No orders of the defined types were placed in this encounter. ? ?Meds ordered this encounter  ?Medications  ? mometasone-formoterol (DULERA) 200-5 MCG/ACT AERO  ?  Sig: Inhale 2 puffs into the lungs in the morning and at bedtime.  ?  Dispense:  3 each  ?  Refill:  3  ? ?I have spent a total time of 32-minutes on the day of the appointment reviewing prior documentation, coordinating care and discussing medical diagnosis and plan with the patient/family. Past medical history, allergies, medications were reviewed. Pertinent imaging, labs and tests included in this note have been reviewed and interpreted independently by me. ? ?Milka Windholz Mechele Collin, MD ?[facility] ?06/11/2021 4:00 PM  ? ? ? ? ? ? ?

## 2021-06-16 DIAGNOSIS — K7581 Nonalcoholic steatohepatitis (NASH): Secondary | ICD-10-CM | POA: Diagnosis not present

## 2021-06-16 DIAGNOSIS — K7460 Unspecified cirrhosis of liver: Secondary | ICD-10-CM | POA: Diagnosis not present

## 2021-07-09 DIAGNOSIS — G894 Chronic pain syndrome: Secondary | ICD-10-CM | POA: Diagnosis not present

## 2021-07-09 DIAGNOSIS — G4700 Insomnia, unspecified: Secondary | ICD-10-CM | POA: Diagnosis not present

## 2021-07-09 DIAGNOSIS — M47816 Spondylosis without myelopathy or radiculopathy, lumbar region: Secondary | ICD-10-CM | POA: Diagnosis not present

## 2021-07-09 DIAGNOSIS — K5903 Drug induced constipation: Secondary | ICD-10-CM | POA: Diagnosis not present

## 2021-08-07 DIAGNOSIS — G894 Chronic pain syndrome: Secondary | ICD-10-CM | POA: Diagnosis not present

## 2021-08-07 DIAGNOSIS — K5903 Drug induced constipation: Secondary | ICD-10-CM | POA: Diagnosis not present

## 2021-08-07 DIAGNOSIS — M47816 Spondylosis without myelopathy or radiculopathy, lumbar region: Secondary | ICD-10-CM | POA: Diagnosis not present

## 2021-08-07 DIAGNOSIS — G4700 Insomnia, unspecified: Secondary | ICD-10-CM | POA: Diagnosis not present

## 2021-08-20 DIAGNOSIS — F419 Anxiety disorder, unspecified: Secondary | ICD-10-CM | POA: Diagnosis not present

## 2021-08-20 DIAGNOSIS — F5105 Insomnia due to other mental disorder: Secondary | ICD-10-CM | POA: Diagnosis not present

## 2021-08-21 ENCOUNTER — Telehealth: Payer: Self-pay | Admitting: Pulmonary Disease

## 2021-08-22 NOTE — Telephone Encounter (Signed)
Patient is returning phone call. Patient phone number is [phone].

## 2021-08-22 NOTE — Telephone Encounter (Signed)
I called the patient and had to leave a message for this patient.

## 2021-08-25 DIAGNOSIS — F419 Anxiety disorder, unspecified: Secondary | ICD-10-CM | POA: Diagnosis not present

## 2021-08-25 DIAGNOSIS — F5105 Insomnia due to other mental disorder: Secondary | ICD-10-CM | POA: Diagnosis not present

## 2021-08-26 NOTE — Telephone Encounter (Signed)
I called the patient and gave him the information and he would like to think a little more about the device before proceeding and he will call back with any further questions.

## 2021-08-29 DIAGNOSIS — G4733 Obstructive sleep apnea (adult) (pediatric): Secondary | ICD-10-CM | POA: Diagnosis not present

## 2021-09-02 DIAGNOSIS — Z79891 Long term (current) use of opiate analgesic: Secondary | ICD-10-CM | POA: Diagnosis not present

## 2021-09-02 DIAGNOSIS — G4700 Insomnia, unspecified: Secondary | ICD-10-CM | POA: Diagnosis not present

## 2021-09-02 DIAGNOSIS — M47816 Spondylosis without myelopathy or radiculopathy, lumbar region: Secondary | ICD-10-CM | POA: Diagnosis not present

## 2021-09-02 DIAGNOSIS — K5903 Drug induced constipation: Secondary | ICD-10-CM | POA: Diagnosis not present

## 2021-09-02 DIAGNOSIS — G894 Chronic pain syndrome: Secondary | ICD-10-CM | POA: Diagnosis not present

## 2021-09-28 DIAGNOSIS — G4733 Obstructive sleep apnea (adult) (pediatric): Secondary | ICD-10-CM | POA: Diagnosis not present

## 2021-10-01 DIAGNOSIS — F5105 Insomnia due to other mental disorder: Secondary | ICD-10-CM | POA: Diagnosis not present

## 2021-10-01 DIAGNOSIS — K5903 Drug induced constipation: Secondary | ICD-10-CM | POA: Diagnosis not present

## 2021-10-01 DIAGNOSIS — M47816 Spondylosis without myelopathy or radiculopathy, lumbar region: Secondary | ICD-10-CM | POA: Diagnosis not present

## 2021-10-01 DIAGNOSIS — F419 Anxiety disorder, unspecified: Secondary | ICD-10-CM | POA: Diagnosis not present

## 2021-10-01 DIAGNOSIS — G4700 Insomnia, unspecified: Secondary | ICD-10-CM | POA: Diagnosis not present

## 2021-10-01 DIAGNOSIS — G894 Chronic pain syndrome: Secondary | ICD-10-CM | POA: Diagnosis not present

## 2021-10-17 DIAGNOSIS — M4316 Spondylolisthesis, lumbar region: Secondary | ICD-10-CM | POA: Diagnosis not present

## 2021-10-17 DIAGNOSIS — F5101 Primary insomnia: Secondary | ICD-10-CM | POA: Diagnosis not present

## 2021-10-17 DIAGNOSIS — K7581 Nonalcoholic steatohepatitis (NASH): Secondary | ICD-10-CM | POA: Diagnosis not present

## 2021-10-17 DIAGNOSIS — M5136 Other intervertebral disc degeneration, lumbar region: Secondary | ICD-10-CM | POA: Diagnosis not present

## 2021-10-29 DIAGNOSIS — G4733 Obstructive sleep apnea (adult) (pediatric): Secondary | ICD-10-CM | POA: Diagnosis not present

## 2021-10-30 DIAGNOSIS — K5903 Drug induced constipation: Secondary | ICD-10-CM | POA: Diagnosis not present

## 2021-10-30 DIAGNOSIS — G4700 Insomnia, unspecified: Secondary | ICD-10-CM | POA: Diagnosis not present

## 2021-10-30 DIAGNOSIS — G894 Chronic pain syndrome: Secondary | ICD-10-CM | POA: Diagnosis not present

## 2021-10-30 DIAGNOSIS — M47816 Spondylosis without myelopathy or radiculopathy, lumbar region: Secondary | ICD-10-CM | POA: Diagnosis not present

## 2021-12-22 DIAGNOSIS — R030 Elevated blood-pressure reading, without diagnosis of hypertension: Secondary | ICD-10-CM | POA: Diagnosis not present

## 2021-12-22 DIAGNOSIS — K7460 Unspecified cirrhosis of liver: Secondary | ICD-10-CM | POA: Diagnosis not present

## 2021-12-22 DIAGNOSIS — R1011 Right upper quadrant pain: Secondary | ICD-10-CM | POA: Diagnosis not present

## 2021-12-25 DIAGNOSIS — G4700 Insomnia, unspecified: Secondary | ICD-10-CM | POA: Diagnosis not present

## 2021-12-25 DIAGNOSIS — G894 Chronic pain syndrome: Secondary | ICD-10-CM | POA: Diagnosis not present

## 2021-12-25 DIAGNOSIS — K5903 Drug induced constipation: Secondary | ICD-10-CM | POA: Diagnosis not present

## 2021-12-25 DIAGNOSIS — M47816 Spondylosis without myelopathy or radiculopathy, lumbar region: Secondary | ICD-10-CM | POA: Diagnosis not present

## 2022-01-01 DIAGNOSIS — R7989 Other specified abnormal findings of blood chemistry: Secondary | ICD-10-CM | POA: Diagnosis not present

## 2022-01-01 DIAGNOSIS — Z23 Encounter for immunization: Secondary | ICD-10-CM | POA: Diagnosis not present

## 2022-01-01 DIAGNOSIS — K7460 Unspecified cirrhosis of liver: Secondary | ICD-10-CM | POA: Diagnosis not present

## 2022-01-02 ENCOUNTER — Other Ambulatory Visit: Payer: Self-pay | Admitting: Physician Assistant

## 2022-01-02 ENCOUNTER — Other Ambulatory Visit (HOSPITAL_COMMUNITY): Payer: Self-pay | Admitting: Physician Assistant

## 2022-01-02 DIAGNOSIS — K7460 Unspecified cirrhosis of liver: Secondary | ICD-10-CM

## 2022-01-09 ENCOUNTER — Encounter (HOSPITAL_COMMUNITY): Payer: Self-pay

## 2022-01-09 ENCOUNTER — Ambulatory Visit (HOSPITAL_COMMUNITY): Payer: BC Managed Care – PPO

## 2022-01-12 DIAGNOSIS — R109 Unspecified abdominal pain: Secondary | ICD-10-CM | POA: Diagnosis not present

## 2022-01-14 ENCOUNTER — Ambulatory Visit (HOSPITAL_COMMUNITY): Payer: BC Managed Care – PPO

## 2022-01-15 DIAGNOSIS — K7581 Nonalcoholic steatohepatitis (NASH): Secondary | ICD-10-CM | POA: Diagnosis not present

## 2022-01-15 DIAGNOSIS — K7460 Unspecified cirrhosis of liver: Secondary | ICD-10-CM | POA: Diagnosis not present

## 2022-01-20 DIAGNOSIS — E1169 Type 2 diabetes mellitus with other specified complication: Secondary | ICD-10-CM | POA: Diagnosis not present

## 2022-01-20 DIAGNOSIS — R109 Unspecified abdominal pain: Secondary | ICD-10-CM | POA: Diagnosis not present

## 2022-01-20 DIAGNOSIS — K7460 Unspecified cirrhosis of liver: Secondary | ICD-10-CM | POA: Diagnosis not present

## 2022-01-22 DIAGNOSIS — K5903 Drug induced constipation: Secondary | ICD-10-CM | POA: Diagnosis not present

## 2022-01-22 DIAGNOSIS — M47816 Spondylosis without myelopathy or radiculopathy, lumbar region: Secondary | ICD-10-CM | POA: Diagnosis not present

## 2022-01-22 DIAGNOSIS — G894 Chronic pain syndrome: Secondary | ICD-10-CM | POA: Diagnosis not present

## 2022-01-22 DIAGNOSIS — G4700 Insomnia, unspecified: Secondary | ICD-10-CM | POA: Diagnosis not present

## 2022-01-27 DIAGNOSIS — R350 Frequency of micturition: Secondary | ICD-10-CM | POA: Diagnosis not present

## 2022-02-02 ENCOUNTER — Ambulatory Visit (HOSPITAL_BASED_OUTPATIENT_CLINIC_OR_DEPARTMENT_OTHER): Payer: BC Managed Care – PPO | Admitting: Pulmonary Disease

## 2022-02-02 DIAGNOSIS — K7581 Nonalcoholic steatohepatitis (NASH): Secondary | ICD-10-CM | POA: Diagnosis not present

## 2022-02-02 DIAGNOSIS — K7460 Unspecified cirrhosis of liver: Secondary | ICD-10-CM | POA: Diagnosis not present

## 2022-02-02 DIAGNOSIS — R109 Unspecified abdominal pain: Secondary | ICD-10-CM | POA: Diagnosis not present

## 2022-02-05 DIAGNOSIS — R1011 Right upper quadrant pain: Secondary | ICD-10-CM | POA: Diagnosis not present

## 2022-02-05 DIAGNOSIS — R634 Abnormal weight loss: Secondary | ICD-10-CM | POA: Diagnosis not present

## 2022-02-16 ENCOUNTER — Ambulatory Visit (HOSPITAL_BASED_OUTPATIENT_CLINIC_OR_DEPARTMENT_OTHER): Payer: BC Managed Care – PPO | Admitting: Pulmonary Disease

## 2022-02-18 ENCOUNTER — Ambulatory Visit (HOSPITAL_BASED_OUTPATIENT_CLINIC_OR_DEPARTMENT_OTHER): Payer: BC Managed Care – PPO | Admitting: Pulmonary Disease

## 2022-02-18 DIAGNOSIS — G8929 Other chronic pain: Secondary | ICD-10-CM | POA: Diagnosis not present

## 2022-02-24 ENCOUNTER — Ambulatory Visit (INDEPENDENT_AMBULATORY_CARE_PROVIDER_SITE_OTHER): Payer: BC Managed Care – PPO | Admitting: Pulmonary Disease

## 2022-02-24 ENCOUNTER — Encounter (HOSPITAL_BASED_OUTPATIENT_CLINIC_OR_DEPARTMENT_OTHER): Payer: Self-pay | Admitting: Pulmonary Disease

## 2022-02-24 VITALS — BP 114/60 | HR 78 | Ht 68.0 in | Wt 191.8 lb

## 2022-02-24 DIAGNOSIS — J984 Other disorders of lung: Secondary | ICD-10-CM

## 2022-02-24 DIAGNOSIS — G4733 Obstructive sleep apnea (adult) (pediatric): Secondary | ICD-10-CM | POA: Diagnosis not present

## 2022-02-24 MED ORDER — BUDESONIDE-FORMOTEROL FUMARATE 160-4.5 MCG/ACT IN AERO: 2.0000 | INHALATION_SPRAY | Freq: Two times a day (BID) | RESPIRATORY_TRACT | 5 refills | Status: AC

## 2022-02-24 NOTE — Patient Instructions (Signed)
OSA --Will obtain CPAP compliance report for review and documentation  Restrictive lung defect --REFILL Symbicort 160-4.5 mcg TWO puffs TWICE a day --ORDER pulmonary function tests  Follow-up with me in 3 months  (March) with PFTs prior to visit

## 2022-02-24 NOTE — Progress Notes (Unsigned)
Synopsis: Referred in 12/2017 for chronic cough  Subjective:   PATIENT ID: Wayne Forbes GENDER: male DOB: August 11, 1960, MRN: [id number]   HPI  Chief Complaint  Patient presents with   Follow-up    No updates or concerns   [name] is a 61 year old male never smoker who presents for follow-up.  2019 - Initially evaluated for a 3-year history of nonproductive cough. He has tried [employer] and a trial of Breo and over-the-counter medications with no changes. However after using albuterol on the day of his PFTs he noticed that he did not cough for 2 to 3 days He is active at baseline and enjoys hunting and working out twice a week which includes cardio and weight training. Dx OSA and on CPAP with improvement.  2022 - On CPAP and symbicort  06/11/21 He reports compliance nightly with his CPAP however keeping it on all night is difficult. When compliant he reports improvement with excessive daytime sleepiness. Denies shortness of breath, wheezing or cough. Compliant with his Symbicort. He is planning to start exercising now that his foot surgery is completed.  02/24/22 Since our last visit he has intentionally lost weight due to his diagnosis of NASH with liver fibrosis. From a respiratory standpoint he has been tolerating his CPAP and feels this helps. Denies shortness of breath, cough or wheezing. Compliant with his Symbicort. Wife presents and provides additional history. He has had difficulty taking a deep breath only due to his liver pain/abdominal pain. No nausea or vomiting. No fevers, chills. No changes in bowel movements.  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 States that environment is well ventilated.  He is required to perform pulmonary function tests every 2 years at work which the results have been normal per patient. He is a non-smoker but reports  childhood exposure to second-hand smoking.     Past Medical History:  Diagnosis Date   Alcohol use    Apnea, sleep    does not wear cpap all time   Calculus of kidney    Constipation    Cough    COVID-19    DDD (degenerative disc disease), lumbar    Elevated ferritin    Elevated liver function tests    Endothelial corneal dystrophy, unspecified eye    Fatty liver 2019   Fuchs' corneal dystrophy    High cholesterol    Insomnia    Kidney stone    Kidney stones    LV dysfunction    OSA (obstructive sleep apnea)    Shingles    Sinusitis    Spondylolisthesis of lumbar region    Steatohepatitis      No Known Allergies   Outpatient Medications Prior to Visit  Medication Sig Dispense Refill   hydrOXYzine (ATARAX) 25 MG tablet See admin instructions.     Melatonin 10 MG TABS Take by mouth.     oxyCODONE (ROXICODONE) 15 MG immediate release tablet Take 1 tablet (15 mg total) by mouth every 4 (four) hours as needed for pain. 30 tablet 0   Probiotic CAPS Take 1 capsule by mouth daily.     valACYclovir (VALTREX) 500 MG tablet Take 1,000 mg by mouth daily as needed (fever blisters).     XTAMPZA ER 18 MG C12A Take 1 capsule by mouth 2 (two) times daily.     zolpidem (AMBIEN CR) 12.5 MG CR tablet Take 12.5 mg by mouth  at bedtime as needed.     zolpidem (AMBIEN) 10 MG tablet Take 10 mg by mouth at bedtime as needed.     albuterol (ACCUNEB) 0.63 MG/3ML nebulizer solution Inhale into the lungs. (Patient not taking: Reported on 06/11/2021)     cephALEXin (KEFLEX) 500 MG capsule Take 1 tablet by mouth tonight, then one tomorrow AM and one in PM (Patient not taking: Reported on 02/24/2022) 3 capsule 0   cyclobenzaprine (FLEXERIL) 10 MG tablet  (Patient not taking: Reported on 02/24/2022)     escitalopram (LEXAPRO) 10 MG tablet Take 1 tablet by mouth daily. (Patient not taking: Reported on 02/24/2022)     FLUoxetine (PROZAC) 20 MG capsule Take 20 mg by mouth daily. (Patient not taking: Reported  on 02/24/2022)     fluticasone (FLONASE) 50 MCG/ACT nasal spray 1 spray in each nostril (Patient not taking: Reported on 02/24/2022)     ipratropium (ATROVENT) 0.06 % nasal spray Place 2 sprays into both nostrils 4 (four) times daily. (Patient not taking: Reported on 02/24/2022) 15 mL 3   lisinopril (ZESTRIL) 10 MG tablet Take by mouth. (Patient not taking: Reported on 02/24/2022)     Methylnaltrexone Bromide (RELISTOR PO) Take by mouth. (Patient not taking: Reported on 02/24/2022)     metoprolol tartrate (LOPRESSOR) 100 MG tablet Take 1 tablet (100 mg total) by mouth once for 1 dose. (Patient not taking: Reported on 06/11/2021) 1 tablet 0   mometasone-formoterol (DULERA) 200-5 MCG/ACT AERO Inhale 2 puffs into the lungs in the morning and at bedtime. (Patient not taking: Reported on 02/24/2022) 3 each 3   Facility-Administered Medications Prior to Visit  Medication Dose Route Frequency Provider Last Rate Last Admin   betamethasone acetate-betamethasone sodium phosphate (CELESTONE) injection 3 mg  3 mg Other [place] Liter, DPM        Review of Systems  Constitutional:  Negative for chills, diaphoresis, fever, malaise/fatigue and weight loss.  HENT:  Negative for congestion.   Respiratory:  Negative for cough, hemoptysis, sputum production, shortness of breath and wheezing.   Cardiovascular:  Negative for chest pain, palpitations and leg swelling.  Gastrointestinal:  Positive for abdominal pain.      Objective:    Vitals:   02/24/22 1512  BP: 114/60  Pulse: 78  SpO2: 98%  Weight: 191 lb 12.8 oz (87 kg)  Height: 5\' 8"  (1.727 m)   Physical Exam: General: Well-appearing, no acute distress HENT: [place], AT Eyes: EOMI, no scleral icterus Respiratory: Clear to auscultation bilaterally.  No crackles, wheezing or rales Cardiovascular: RRR, -M/R/G, no JVD Abdomen: Tender to palpation, non-distended, +BS, no rebound tenderness Extremities:-Edema,-tenderness Neuro: AAO x4, CNII-XII  grossly intact Psych: Normal mood, normal affect   Data Reviewed:  Chest imaging: CXR 7/28 4/16-unremarkable with no evidence of acute infiltrates, effusions, edema.  Left lower lobe calcification. CT Chest 02/22/18 - Old granulomas CT Coronary 10/02/20 - No CAD. Lung parenchyma with no evidence of parenchymal abnormalities. No infiltrates, effusion or edema  PFT:  01/17/18 - Mild restrictive defect. No obstruction or significant bronchodilator effect. Normal DLCO  Sleep study: Home sleep study 01/17/18 - moderate obstructive sleep apnea with an AHI of 21.4 and SpO2 low of 78%.  CPAP compliance 11/26/21-02/23/22 Days Used 89/90 (99%) Days >4 hours 63/90 (70%) AHI 0.5 Autopap 5-15  Assessment & Plan:   Discussion: 61 year old male never smoker with OSA on CPAP, mild restrictive lung defect, HLD, NASH with stage 3-4 fibrosis who presents for follow-up. Hx of significant  environmental exposure with cleaning chemicals and flame retardants. Discussed clinical course and management of restrictive lung defect including bronchodilator regimen and action plan for exacerbation.  Moderate Obstructive Sleep Apnea Patient uses NIV for more than four hours nightly for at least 70% of nights during the last three months of usage. The patient has been using and benefiting from PAP use and will continue to benefit from therapy.  --Counseled on sleep hygiene --Counseled on weight loss/maintenance of healthy weight --Counseled NOT to drive if/when sleepy --May consider Inspire in the future.   Shortness of breath, wheezing Restrictive lung defect Likely environmental related. No evidence of parenchymal disease on CT in 2019 --REFILL Symbicort 160-4.5 mcg TWO puffs TWICE a day --ORDER pulmonary function tests  NASH with stage III/IV fibrosis --Followed by [facility]. Previously in clinical trial with elevated LFTs    Health Maintenance Immunization History  Administered Date(s) Administered    Influenza Inj Mdck Quad Pf 12/28/2017   Influenza Split 12/21/2016, 12/07/2017, 11/08/2019   Influenza,inj,Quad PF,6+ Mos 12/28/2018   PFIZER(Purple Top)SARS-COV-2 Vaccination 08/17/2019, 09/07/2019   Tdap 06/30/2011   No orders of the defined types were placed in this encounter.  Meds ordered this encounter  Medications   budesonide-formoterol (SYMBICORT) 160-4.5 MCG/ACT inhaler    Sig: Inhale 2 puffs into the lungs in the morning and at bedtime.    Dispense:  1 each    Refill:  5   Return in about 3 months (around 05/26/2022).  I have spent a total time of 25-minutes on the day of the appointment including chart review, data review, collecting history, coordinating care and discussing medical diagnosis and plan with the patient/family. Past medical history, allergies, medications were reviewed. Pertinent imaging, labs and tests included in this note have been reviewed and interpreted independently by me.  Landon Bassford Mechele Collin, MD [facility] 02/24/2022 3:18 PM

## 2022-02-25 ENCOUNTER — Encounter (HOSPITAL_BASED_OUTPATIENT_CLINIC_OR_DEPARTMENT_OTHER): Payer: Self-pay | Admitting: Pulmonary Disease

## 2022-03-12 DIAGNOSIS — K5903 Drug induced constipation: Secondary | ICD-10-CM | POA: Diagnosis not present

## 2022-03-12 DIAGNOSIS — G894 Chronic pain syndrome: Secondary | ICD-10-CM | POA: Diagnosis not present

## 2022-03-12 DIAGNOSIS — M47816 Spondylosis without myelopathy or radiculopathy, lumbar region: Secondary | ICD-10-CM | POA: Diagnosis not present

## 2022-03-12 DIAGNOSIS — G4700 Insomnia, unspecified: Secondary | ICD-10-CM | POA: Diagnosis not present

## 2022-03-19 DIAGNOSIS — K7460 Unspecified cirrhosis of liver: Secondary | ICD-10-CM | POA: Diagnosis not present

## 2022-03-19 DIAGNOSIS — K7581 Nonalcoholic steatohepatitis (NASH): Secondary | ICD-10-CM | POA: Diagnosis not present

## 2022-03-26 ENCOUNTER — Other Ambulatory Visit: Payer: Self-pay | Admitting: Physician Assistant

## 2022-03-26 DIAGNOSIS — K7460 Unspecified cirrhosis of liver: Secondary | ICD-10-CM

## 2022-03-27 DIAGNOSIS — K7581 Nonalcoholic steatohepatitis (NASH): Secondary | ICD-10-CM | POA: Diagnosis not present

## 2022-04-08 DIAGNOSIS — G4700 Insomnia, unspecified: Secondary | ICD-10-CM | POA: Diagnosis not present

## 2022-04-08 DIAGNOSIS — M47816 Spondylosis without myelopathy or radiculopathy, lumbar region: Secondary | ICD-10-CM | POA: Diagnosis not present

## 2022-04-08 DIAGNOSIS — G894 Chronic pain syndrome: Secondary | ICD-10-CM | POA: Diagnosis not present

## 2022-04-08 DIAGNOSIS — K5903 Drug induced constipation: Secondary | ICD-10-CM | POA: Diagnosis not present

## 2022-04-16 ENCOUNTER — Ambulatory Visit
Admission: RE | Admit: 2022-04-16 | Discharge: 2022-04-16 | Disposition: A | Discharge status: Home / Self Care | Payer: BC Managed Care – PPO | Source: Ambulatory Visit | Attending: Physician Assistant | Admitting: Physician Assistant

## 2022-04-16 DIAGNOSIS — K7460 Unspecified cirrhosis of liver: Secondary | ICD-10-CM

## 2022-04-22 DIAGNOSIS — K7581 Nonalcoholic steatohepatitis (NASH): Secondary | ICD-10-CM | POA: Diagnosis not present

## 2022-04-22 DIAGNOSIS — K7400 Hepatic fibrosis, unspecified: Secondary | ICD-10-CM | POA: Diagnosis not present

## 2022-04-22 DIAGNOSIS — G8929 Other chronic pain: Secondary | ICD-10-CM | POA: Diagnosis not present

## 2022-04-27 DIAGNOSIS — R109 Unspecified abdominal pain: Secondary | ICD-10-CM | POA: Diagnosis not present

## 2022-05-07 DIAGNOSIS — K648 Other hemorrhoids: Secondary | ICD-10-CM | POA: Diagnosis not present

## 2022-05-07 DIAGNOSIS — K2281 Esophageal polyp: Secondary | ICD-10-CM | POA: Diagnosis not present

## 2022-05-07 DIAGNOSIS — K7581 Nonalcoholic steatohepatitis (NASH): Secondary | ICD-10-CM | POA: Diagnosis not present

## 2022-05-07 DIAGNOSIS — R1013 Epigastric pain: Secondary | ICD-10-CM | POA: Diagnosis not present

## 2022-05-07 DIAGNOSIS — K449 Diaphragmatic hernia without obstruction or gangrene: Secondary | ICD-10-CM | POA: Diagnosis not present

## 2022-05-07 DIAGNOSIS — G4733 Obstructive sleep apnea (adult) (pediatric): Secondary | ICD-10-CM | POA: Diagnosis not present

## 2022-05-07 DIAGNOSIS — K635 Polyp of colon: Secondary | ICD-10-CM | POA: Diagnosis not present

## 2022-05-07 DIAGNOSIS — K3189 Other diseases of stomach and duodenum: Secondary | ICD-10-CM | POA: Diagnosis not present

## 2022-05-07 DIAGNOSIS — D130 Benign neoplasm of esophagus: Secondary | ICD-10-CM | POA: Diagnosis not present

## 2022-05-07 DIAGNOSIS — R109 Unspecified abdominal pain: Secondary | ICD-10-CM | POA: Diagnosis not present

## 2022-05-07 DIAGNOSIS — Z79899 Other long term (current) drug therapy: Secondary | ICD-10-CM | POA: Diagnosis not present

## 2022-05-11 DIAGNOSIS — M47816 Spondylosis without myelopathy or radiculopathy, lumbar region: Secondary | ICD-10-CM | POA: Diagnosis not present

## 2022-05-11 DIAGNOSIS — G894 Chronic pain syndrome: Secondary | ICD-10-CM | POA: Diagnosis not present

## 2022-05-11 DIAGNOSIS — K5903 Drug induced constipation: Secondary | ICD-10-CM | POA: Diagnosis not present

## 2022-05-11 DIAGNOSIS — G4700 Insomnia, unspecified: Secondary | ICD-10-CM | POA: Diagnosis not present

## 2022-05-26 ENCOUNTER — Encounter (HOSPITAL_BASED_OUTPATIENT_CLINIC_OR_DEPARTMENT_OTHER): Payer: BC Managed Care – PPO

## 2022-05-26 ENCOUNTER — Ambulatory Visit (HOSPITAL_BASED_OUTPATIENT_CLINIC_OR_DEPARTMENT_OTHER): Payer: BC Managed Care – PPO | Admitting: Pulmonary Disease

## 2022-05-27 DIAGNOSIS — R109 Unspecified abdominal pain: Secondary | ICD-10-CM | POA: Diagnosis not present

## 2022-05-27 DIAGNOSIS — K7460 Unspecified cirrhosis of liver: Secondary | ICD-10-CM | POA: Diagnosis not present

## 2022-05-27 DIAGNOSIS — K7581 Nonalcoholic steatohepatitis (NASH): Secondary | ICD-10-CM | POA: Diagnosis not present

## 2022-06-10 ENCOUNTER — Other Ambulatory Visit (HOSPITAL_BASED_OUTPATIENT_CLINIC_OR_DEPARTMENT_OTHER): Payer: Self-pay

## 2022-06-10 DIAGNOSIS — K5903 Drug induced constipation: Secondary | ICD-10-CM | POA: Diagnosis not present

## 2022-06-10 DIAGNOSIS — J45991 Cough variant asthma: Secondary | ICD-10-CM

## 2022-06-10 DIAGNOSIS — G4700 Insomnia, unspecified: Secondary | ICD-10-CM | POA: Diagnosis not present

## 2022-06-10 DIAGNOSIS — M47816 Spondylosis without myelopathy or radiculopathy, lumbar region: Secondary | ICD-10-CM | POA: Diagnosis not present

## 2022-06-10 DIAGNOSIS — G894 Chronic pain syndrome: Secondary | ICD-10-CM | POA: Diagnosis not present

## 2022-06-11 ENCOUNTER — Encounter (HOSPITAL_BASED_OUTPATIENT_CLINIC_OR_DEPARTMENT_OTHER): Payer: BC Managed Care – PPO

## 2022-06-11 ENCOUNTER — Ambulatory Visit (HOSPITAL_BASED_OUTPATIENT_CLINIC_OR_DEPARTMENT_OTHER): Payer: BC Managed Care – PPO | Admitting: Pulmonary Disease

## 2022-07-08 DIAGNOSIS — Z79891 Long term (current) use of opiate analgesic: Secondary | ICD-10-CM | POA: Diagnosis not present

## 2022-07-08 DIAGNOSIS — K5903 Drug induced constipation: Secondary | ICD-10-CM | POA: Diagnosis not present

## 2022-07-08 DIAGNOSIS — G4700 Insomnia, unspecified: Secondary | ICD-10-CM | POA: Diagnosis not present

## 2022-07-08 DIAGNOSIS — M47816 Spondylosis without myelopathy or radiculopathy, lumbar region: Secondary | ICD-10-CM | POA: Diagnosis not present

## 2022-07-08 DIAGNOSIS — G894 Chronic pain syndrome: Secondary | ICD-10-CM | POA: Diagnosis not present

## 2022-07-22 IMAGING — CR DG CHEST 2V
2 series · 2 of 2 positions shown · non-contrast
Comparison: Chest CT dated 02/22/2018.

CLINICAL DATA: Shortness of breath on exertion.

EXAM:
CHEST - 2 VIEW

[w chest pa]
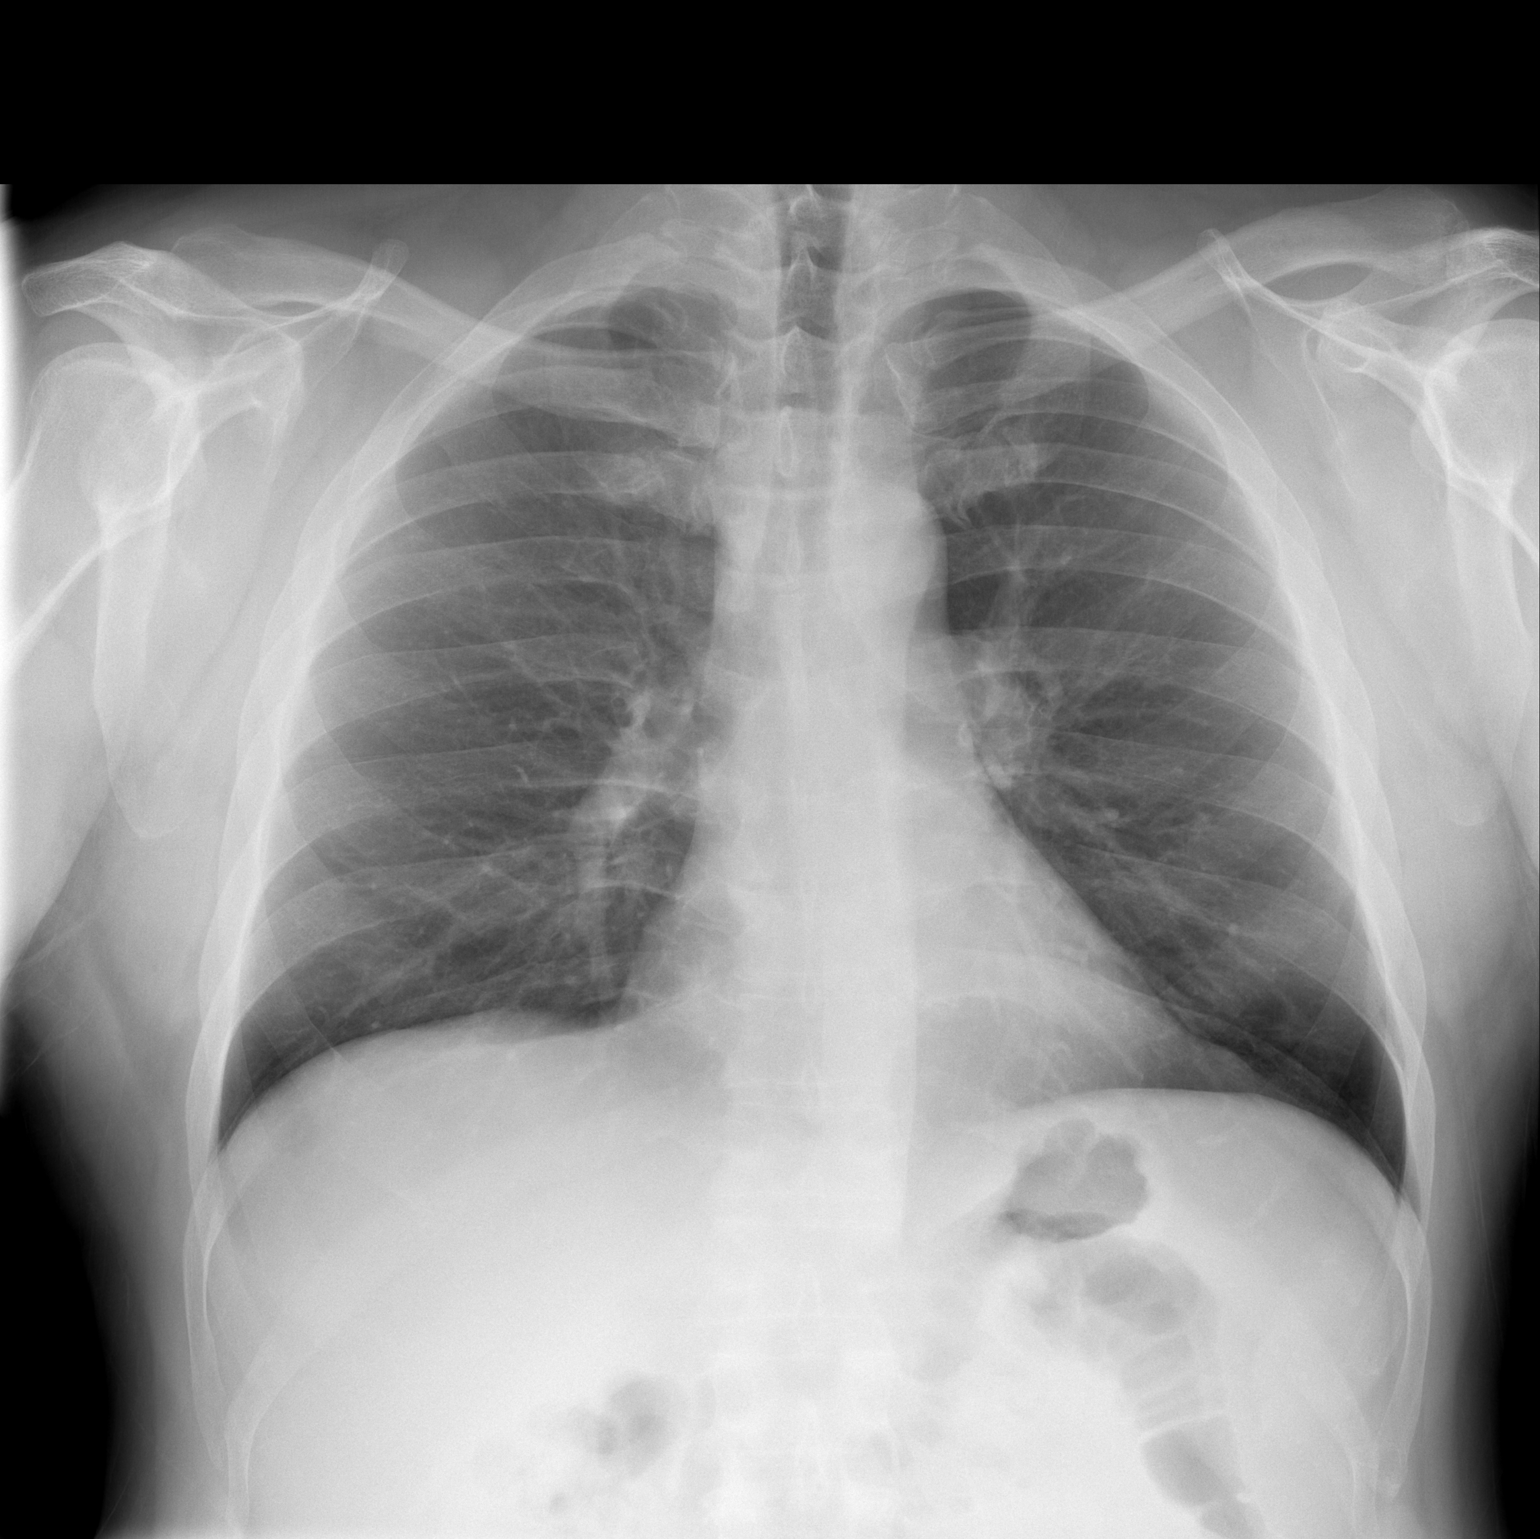

[w chest lat]
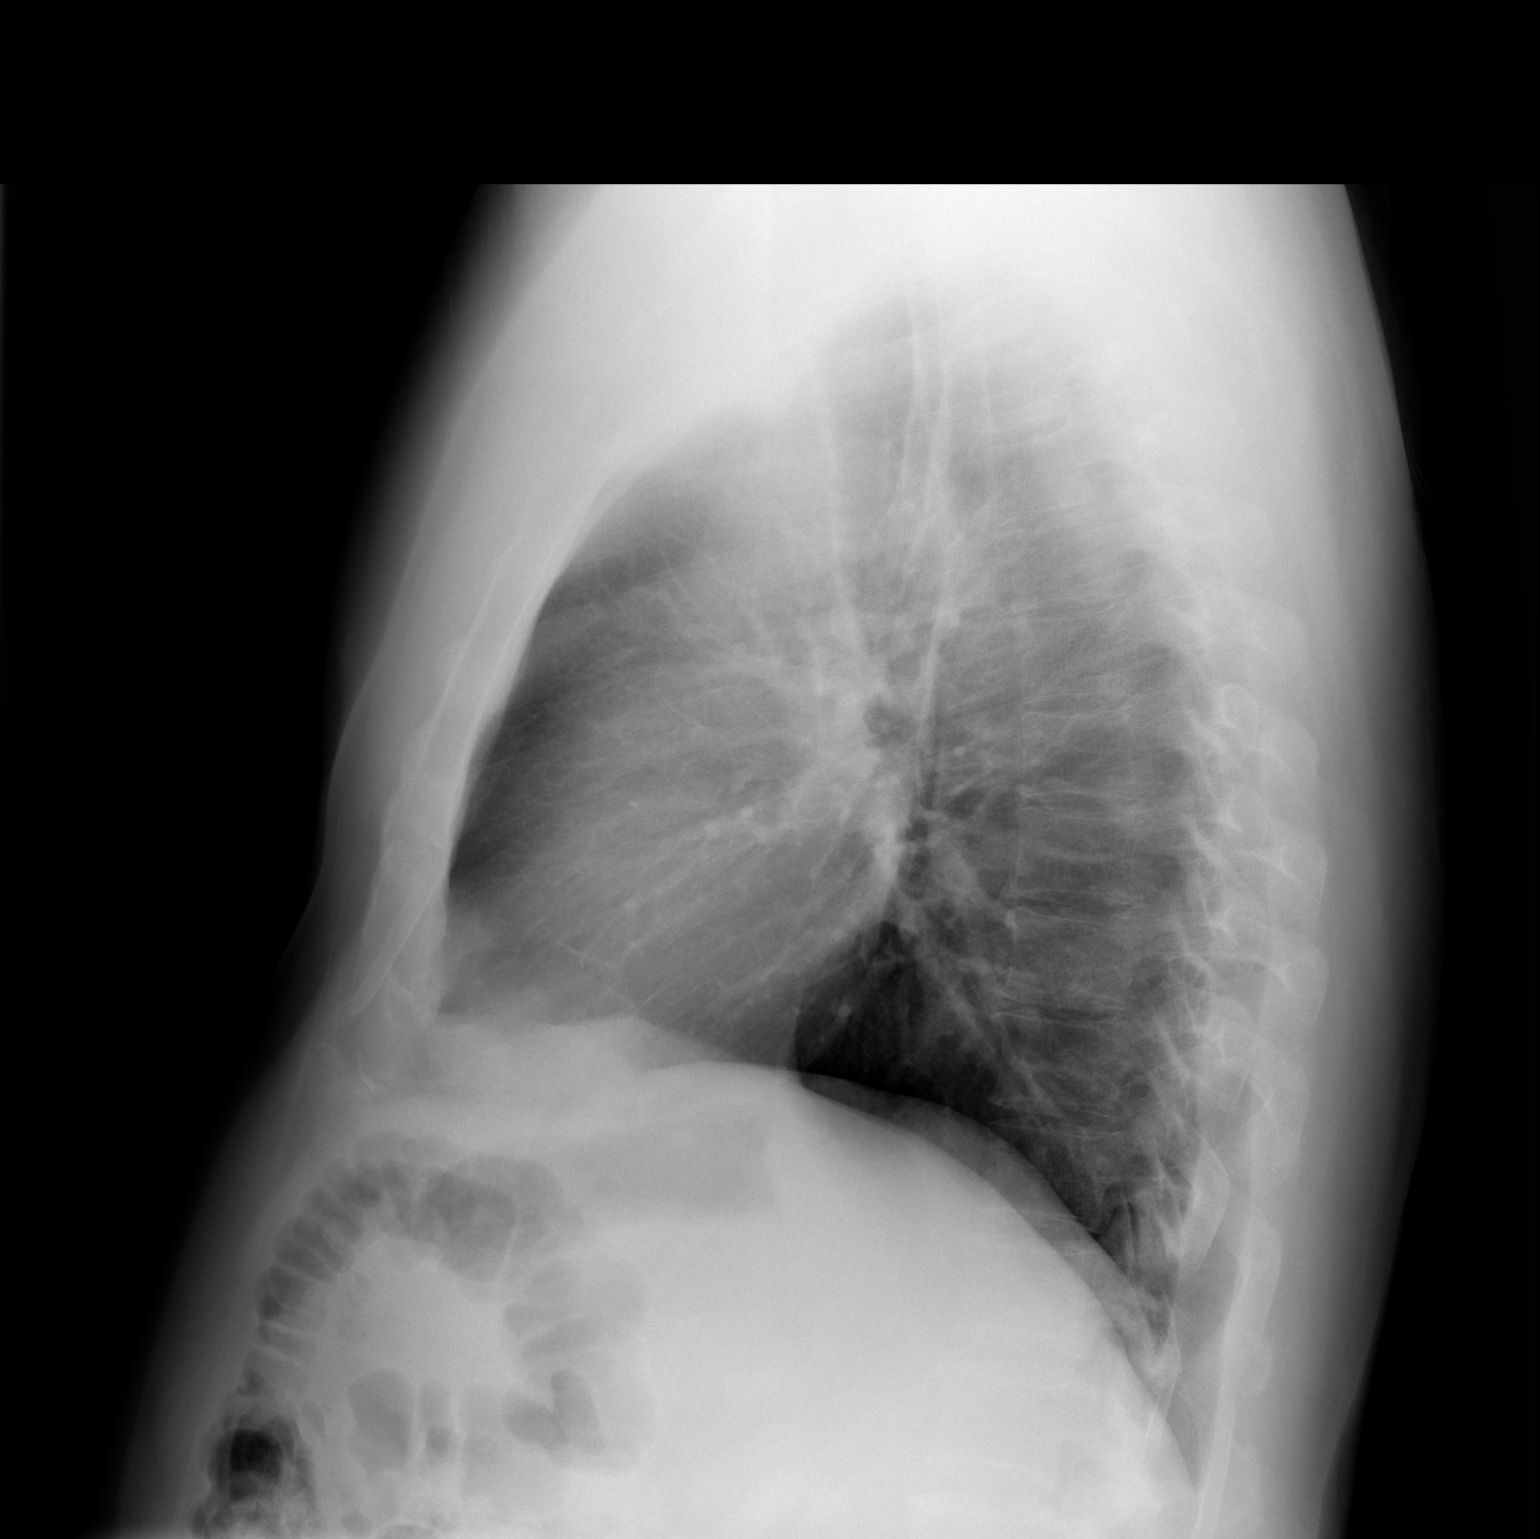

[2 of 2 positions shown; findings below may reference images not displayed]

FINDINGS: Normal sized heart. Clear lungs with normal vascularity.
Unremarkable bones. Previously noted small calcified granulomata in
the spleen.
IMPRESSION: No acute abnormality.

## 2022-07-23 DIAGNOSIS — Z0000 Encounter for general adult medical examination without abnormal findings: Secondary | ICD-10-CM | POA: Diagnosis not present

## 2022-07-23 DIAGNOSIS — E559 Vitamin D deficiency, unspecified: Secondary | ICD-10-CM | POA: Diagnosis not present

## 2022-07-23 DIAGNOSIS — Z13220 Encounter for screening for lipoid disorders: Secondary | ICD-10-CM | POA: Diagnosis not present

## 2022-07-23 DIAGNOSIS — R5382 Chronic fatigue, unspecified: Secondary | ICD-10-CM | POA: Diagnosis not present

## 2022-07-23 DIAGNOSIS — Z79899 Other long term (current) drug therapy: Secondary | ICD-10-CM | POA: Diagnosis not present

## 2022-07-23 DIAGNOSIS — E1165 Type 2 diabetes mellitus with hyperglycemia: Secondary | ICD-10-CM | POA: Diagnosis not present

## 2022-07-24 ENCOUNTER — Encounter (HOSPITAL_BASED_OUTPATIENT_CLINIC_OR_DEPARTMENT_OTHER): Payer: Self-pay | Admitting: Pulmonary Disease

## 2022-08-05 DIAGNOSIS — K5903 Drug induced constipation: Secondary | ICD-10-CM | POA: Diagnosis not present

## 2022-08-05 DIAGNOSIS — G894 Chronic pain syndrome: Secondary | ICD-10-CM | POA: Diagnosis not present

## 2022-08-05 DIAGNOSIS — M47816 Spondylosis without myelopathy or radiculopathy, lumbar region: Secondary | ICD-10-CM | POA: Diagnosis not present

## 2022-08-05 DIAGNOSIS — G4700 Insomnia, unspecified: Secondary | ICD-10-CM | POA: Diagnosis not present

## 2022-08-21 DIAGNOSIS — R109 Unspecified abdominal pain: Secondary | ICD-10-CM | POA: Diagnosis not present

## 2022-08-28 DIAGNOSIS — F4322 Adjustment disorder with anxiety: Secondary | ICD-10-CM | POA: Diagnosis not present

## 2022-08-28 DIAGNOSIS — F411 Generalized anxiety disorder: Secondary | ICD-10-CM | POA: Diagnosis not present

## 2022-09-02 DIAGNOSIS — G894 Chronic pain syndrome: Secondary | ICD-10-CM | POA: Diagnosis not present

## 2022-09-02 DIAGNOSIS — K5903 Drug induced constipation: Secondary | ICD-10-CM | POA: Diagnosis not present

## 2022-09-02 DIAGNOSIS — M47816 Spondylosis without myelopathy or radiculopathy, lumbar region: Secondary | ICD-10-CM | POA: Diagnosis not present

## 2022-09-02 DIAGNOSIS — G4700 Insomnia, unspecified: Secondary | ICD-10-CM | POA: Diagnosis not present

## 2022-09-09 DIAGNOSIS — R790 Abnormal level of blood mineral: Secondary | ICD-10-CM | POA: Diagnosis not present

## 2022-09-09 DIAGNOSIS — R634 Abnormal weight loss: Secondary | ICD-10-CM | POA: Diagnosis not present

## 2022-09-09 DIAGNOSIS — F32A Depression, unspecified: Secondary | ICD-10-CM | POA: Diagnosis not present

## 2022-09-14 DIAGNOSIS — K7581 Nonalcoholic steatohepatitis (NASH): Secondary | ICD-10-CM | POA: Diagnosis not present

## 2022-09-22 DIAGNOSIS — J0110 Acute frontal sinusitis, unspecified: Secondary | ICD-10-CM | POA: Diagnosis not present

## 2022-09-22 DIAGNOSIS — Z6825 Body mass index (BMI) 25.0-25.9, adult: Secondary | ICD-10-CM | POA: Diagnosis not present

## 2022-09-25 ENCOUNTER — Ambulatory Visit
Admission: RE | Admit: 2022-09-25 | Discharge: 2022-09-25 | Disposition: A | Discharge status: Home / Self Care | Payer: BC Managed Care – PPO | Source: Ambulatory Visit | Attending: Internal Medicine | Admitting: Internal Medicine

## 2022-09-25 ENCOUNTER — Other Ambulatory Visit: Payer: Self-pay | Admitting: Internal Medicine

## 2022-09-25 DIAGNOSIS — R634 Abnormal weight loss: Secondary | ICD-10-CM

## 2022-09-25 DIAGNOSIS — R1031 Right lower quadrant pain: Secondary | ICD-10-CM | POA: Diagnosis not present

## 2022-09-30 DIAGNOSIS — M47816 Spondylosis without myelopathy or radiculopathy, lumbar region: Secondary | ICD-10-CM | POA: Diagnosis not present

## 2022-09-30 DIAGNOSIS — K5903 Drug induced constipation: Secondary | ICD-10-CM | POA: Diagnosis not present

## 2022-09-30 DIAGNOSIS — G894 Chronic pain syndrome: Secondary | ICD-10-CM | POA: Diagnosis not present

## 2022-09-30 DIAGNOSIS — G4700 Insomnia, unspecified: Secondary | ICD-10-CM | POA: Diagnosis not present

## 2022-10-16 DIAGNOSIS — J342 Deviated nasal septum: Secondary | ICD-10-CM | POA: Diagnosis not present

## 2022-10-16 DIAGNOSIS — R0981 Nasal congestion: Secondary | ICD-10-CM | POA: Diagnosis not present

## 2022-10-20 DIAGNOSIS — F411 Generalized anxiety disorder: Secondary | ICD-10-CM | POA: Diagnosis not present

## 2022-10-20 DIAGNOSIS — F4322 Adjustment disorder with anxiety: Secondary | ICD-10-CM | POA: Diagnosis not present

## 2022-10-28 DIAGNOSIS — M47816 Spondylosis without myelopathy or radiculopathy, lumbar region: Secondary | ICD-10-CM | POA: Diagnosis not present

## 2022-10-28 DIAGNOSIS — K5903 Drug induced constipation: Secondary | ICD-10-CM | POA: Diagnosis not present

## 2022-10-28 DIAGNOSIS — G4700 Insomnia, unspecified: Secondary | ICD-10-CM | POA: Diagnosis not present

## 2022-10-28 DIAGNOSIS — G894 Chronic pain syndrome: Secondary | ICD-10-CM | POA: Diagnosis not present

## 2022-11-05 DIAGNOSIS — F32A Depression, unspecified: Secondary | ICD-10-CM | POA: Diagnosis not present

## 2022-11-05 DIAGNOSIS — Z79899 Other long term (current) drug therapy: Secondary | ICD-10-CM | POA: Diagnosis not present

## 2022-11-05 DIAGNOSIS — E118 Type 2 diabetes mellitus with unspecified complications: Secondary | ICD-10-CM | POA: Diagnosis not present

## 2022-11-05 DIAGNOSIS — R790 Abnormal level of blood mineral: Secondary | ICD-10-CM | POA: Diagnosis not present

## 2022-11-05 DIAGNOSIS — R634 Abnormal weight loss: Secondary | ICD-10-CM | POA: Diagnosis not present

## 2022-11-05 DIAGNOSIS — Z125 Encounter for screening for malignant neoplasm of prostate: Secondary | ICD-10-CM | POA: Diagnosis not present

## 2022-11-16 DIAGNOSIS — K5903 Drug induced constipation: Secondary | ICD-10-CM | POA: Diagnosis not present

## 2022-11-16 DIAGNOSIS — G4700 Insomnia, unspecified: Secondary | ICD-10-CM | POA: Diagnosis not present

## 2022-11-16 DIAGNOSIS — G894 Chronic pain syndrome: Secondary | ICD-10-CM | POA: Diagnosis not present

## 2022-11-16 DIAGNOSIS — M47816 Spondylosis without myelopathy or radiculopathy, lumbar region: Secondary | ICD-10-CM | POA: Diagnosis not present

## 2022-11-27 DIAGNOSIS — K7581 Nonalcoholic steatohepatitis (NASH): Secondary | ICD-10-CM | POA: Diagnosis not present

## 2022-11-27 DIAGNOSIS — K7460 Unspecified cirrhosis of liver: Secondary | ICD-10-CM | POA: Diagnosis not present

## 2022-11-27 DIAGNOSIS — K760 Fatty (change of) liver, not elsewhere classified: Secondary | ICD-10-CM | POA: Diagnosis not present

## 2022-12-21 DIAGNOSIS — K7469 Other cirrhosis of liver: Secondary | ICD-10-CM | POA: Diagnosis not present

## 2022-12-22 DIAGNOSIS — G4700 Insomnia, unspecified: Secondary | ICD-10-CM | POA: Diagnosis not present

## 2022-12-22 DIAGNOSIS — G894 Chronic pain syndrome: Secondary | ICD-10-CM | POA: Diagnosis not present

## 2022-12-22 DIAGNOSIS — M47816 Spondylosis without myelopathy or radiculopathy, lumbar region: Secondary | ICD-10-CM | POA: Diagnosis not present

## 2022-12-22 DIAGNOSIS — K5903 Drug induced constipation: Secondary | ICD-10-CM | POA: Diagnosis not present

## 2022-12-30 DIAGNOSIS — K7469 Other cirrhosis of liver: Secondary | ICD-10-CM | POA: Diagnosis not present

## 2023-01-08 DIAGNOSIS — Z79899 Other long term (current) drug therapy: Secondary | ICD-10-CM | POA: Diagnosis not present

## 2023-01-19 DIAGNOSIS — G894 Chronic pain syndrome: Secondary | ICD-10-CM | POA: Diagnosis not present

## 2023-01-19 DIAGNOSIS — G4700 Insomnia, unspecified: Secondary | ICD-10-CM | POA: Diagnosis not present

## 2023-01-19 DIAGNOSIS — M47816 Spondylosis without myelopathy or radiculopathy, lumbar region: Secondary | ICD-10-CM | POA: Diagnosis not present

## 2023-01-19 DIAGNOSIS — K5903 Drug induced constipation: Secondary | ICD-10-CM | POA: Diagnosis not present

## 2023-02-17 DIAGNOSIS — K5903 Drug induced constipation: Secondary | ICD-10-CM | POA: Diagnosis not present

## 2023-02-17 DIAGNOSIS — M47816 Spondylosis without myelopathy or radiculopathy, lumbar region: Secondary | ICD-10-CM | POA: Diagnosis not present

## 2023-02-17 DIAGNOSIS — G4700 Insomnia, unspecified: Secondary | ICD-10-CM | POA: Diagnosis not present

## 2023-02-17 DIAGNOSIS — G894 Chronic pain syndrome: Secondary | ICD-10-CM | POA: Diagnosis not present

## 2023-04-19 DIAGNOSIS — M47816 Spondylosis without myelopathy or radiculopathy, lumbar region: Secondary | ICD-10-CM | POA: Diagnosis not present

## 2023-04-19 DIAGNOSIS — G894 Chronic pain syndrome: Secondary | ICD-10-CM | POA: Diagnosis not present

## 2023-04-19 DIAGNOSIS — G4700 Insomnia, unspecified: Secondary | ICD-10-CM | POA: Diagnosis not present

## 2023-04-19 DIAGNOSIS — K5903 Drug induced constipation: Secondary | ICD-10-CM | POA: Diagnosis not present

## 2023-04-20 DIAGNOSIS — F411 Generalized anxiety disorder: Secondary | ICD-10-CM | POA: Diagnosis not present

## 2023-04-20 DIAGNOSIS — F5101 Primary insomnia: Secondary | ICD-10-CM | POA: Diagnosis not present

## 2023-04-22 DIAGNOSIS — K7469 Other cirrhosis of liver: Secondary | ICD-10-CM | POA: Diagnosis not present

## 2023-05-18 DIAGNOSIS — K7460 Unspecified cirrhosis of liver: Secondary | ICD-10-CM | POA: Diagnosis not present

## 2023-05-18 DIAGNOSIS — K7581 Nonalcoholic steatohepatitis (NASH): Secondary | ICD-10-CM | POA: Diagnosis not present

## 2023-05-19 DIAGNOSIS — K5903 Drug induced constipation: Secondary | ICD-10-CM | POA: Diagnosis not present

## 2023-05-19 DIAGNOSIS — G894 Chronic pain syndrome: Secondary | ICD-10-CM | POA: Diagnosis not present

## 2023-05-19 DIAGNOSIS — M47816 Spondylosis without myelopathy or radiculopathy, lumbar region: Secondary | ICD-10-CM | POA: Diagnosis not present

## 2023-05-19 DIAGNOSIS — G4700 Insomnia, unspecified: Secondary | ICD-10-CM | POA: Diagnosis not present

## 2023-06-17 DIAGNOSIS — M47816 Spondylosis without myelopathy or radiculopathy, lumbar region: Secondary | ICD-10-CM | POA: Diagnosis not present

## 2023-06-17 DIAGNOSIS — G4700 Insomnia, unspecified: Secondary | ICD-10-CM | POA: Diagnosis not present

## 2023-06-17 DIAGNOSIS — G894 Chronic pain syndrome: Secondary | ICD-10-CM | POA: Diagnosis not present

## 2023-06-17 DIAGNOSIS — K5903 Drug induced constipation: Secondary | ICD-10-CM | POA: Diagnosis not present

## 2023-07-01 DIAGNOSIS — K7581 Nonalcoholic steatohepatitis (NASH): Secondary | ICD-10-CM | POA: Diagnosis not present

## 2023-07-01 DIAGNOSIS — K7460 Unspecified cirrhosis of liver: Secondary | ICD-10-CM | POA: Diagnosis not present

## 2023-07-15 DIAGNOSIS — M47816 Spondylosis without myelopathy or radiculopathy, lumbar region: Secondary | ICD-10-CM | POA: Diagnosis not present

## 2023-07-15 DIAGNOSIS — K5903 Drug induced constipation: Secondary | ICD-10-CM | POA: Diagnosis not present

## 2023-07-15 DIAGNOSIS — G894 Chronic pain syndrome: Secondary | ICD-10-CM | POA: Diagnosis not present

## 2023-07-15 DIAGNOSIS — G4700 Insomnia, unspecified: Secondary | ICD-10-CM | POA: Diagnosis not present

## 2023-08-11 DIAGNOSIS — G894 Chronic pain syndrome: Secondary | ICD-10-CM | POA: Diagnosis not present

## 2023-08-11 DIAGNOSIS — M47816 Spondylosis without myelopathy or radiculopathy, lumbar region: Secondary | ICD-10-CM | POA: Diagnosis not present

## 2023-08-11 DIAGNOSIS — G4700 Insomnia, unspecified: Secondary | ICD-10-CM | POA: Diagnosis not present

## 2023-08-11 DIAGNOSIS — K5903 Drug induced constipation: Secondary | ICD-10-CM | POA: Diagnosis not present

## 2023-09-14 DIAGNOSIS — G4700 Insomnia, unspecified: Secondary | ICD-10-CM | POA: Diagnosis not present

## 2023-09-14 DIAGNOSIS — G894 Chronic pain syndrome: Secondary | ICD-10-CM | POA: Diagnosis not present

## 2023-09-14 DIAGNOSIS — M47816 Spondylosis without myelopathy or radiculopathy, lumbar region: Secondary | ICD-10-CM | POA: Diagnosis not present

## 2023-09-14 DIAGNOSIS — K5903 Drug induced constipation: Secondary | ICD-10-CM | POA: Diagnosis not present

## 2023-10-12 DIAGNOSIS — F411 Generalized anxiety disorder: Secondary | ICD-10-CM | POA: Diagnosis not present

## 2023-10-12 DIAGNOSIS — K5903 Drug induced constipation: Secondary | ICD-10-CM | POA: Diagnosis not present

## 2023-10-12 DIAGNOSIS — G894 Chronic pain syndrome: Secondary | ICD-10-CM | POA: Diagnosis not present

## 2023-10-12 DIAGNOSIS — F5101 Primary insomnia: Secondary | ICD-10-CM | POA: Diagnosis not present

## 2023-10-12 DIAGNOSIS — G4700 Insomnia, unspecified: Secondary | ICD-10-CM | POA: Diagnosis not present

## 2023-10-12 DIAGNOSIS — M47816 Spondylosis without myelopathy or radiculopathy, lumbar region: Secondary | ICD-10-CM | POA: Diagnosis not present

## 2023-11-01 DIAGNOSIS — Z79899 Other long term (current) drug therapy: Secondary | ICD-10-CM | POA: Diagnosis not present

## 2023-11-01 DIAGNOSIS — F32A Depression, unspecified: Secondary | ICD-10-CM | POA: Diagnosis not present

## 2023-11-01 DIAGNOSIS — Z0000 Encounter for general adult medical examination without abnormal findings: Secondary | ICD-10-CM | POA: Diagnosis not present

## 2023-11-01 DIAGNOSIS — K7581 Nonalcoholic steatohepatitis (NASH): Secondary | ICD-10-CM | POA: Diagnosis not present

## 2023-11-01 DIAGNOSIS — E118 Type 2 diabetes mellitus with unspecified complications: Secondary | ICD-10-CM | POA: Diagnosis not present

## 2023-11-01 DIAGNOSIS — Z125 Encounter for screening for malignant neoplasm of prostate: Secondary | ICD-10-CM | POA: Diagnosis not present

## 2023-11-01 DIAGNOSIS — E559 Vitamin D deficiency, unspecified: Secondary | ICD-10-CM | POA: Diagnosis not present

## 2023-11-09 DIAGNOSIS — K5903 Drug induced constipation: Secondary | ICD-10-CM | POA: Diagnosis not present

## 2023-11-09 DIAGNOSIS — G4700 Insomnia, unspecified: Secondary | ICD-10-CM | POA: Diagnosis not present

## 2023-11-09 DIAGNOSIS — M47816 Spondylosis without myelopathy or radiculopathy, lumbar region: Secondary | ICD-10-CM | POA: Diagnosis not present

## 2023-11-09 DIAGNOSIS — G894 Chronic pain syndrome: Secondary | ICD-10-CM | POA: Diagnosis not present

## 2023-11-30 DIAGNOSIS — K7460 Unspecified cirrhosis of liver: Secondary | ICD-10-CM | POA: Diagnosis not present

## 2023-11-30 DIAGNOSIS — K7581 Nonalcoholic steatohepatitis (NASH): Secondary | ICD-10-CM | POA: Diagnosis not present

## 2023-11-30 DIAGNOSIS — Z1289 Encounter for screening for malignant neoplasm of other sites: Secondary | ICD-10-CM | POA: Diagnosis not present

## 2023-12-31 DIAGNOSIS — K7581 Nonalcoholic steatohepatitis (NASH): Secondary | ICD-10-CM | POA: Diagnosis not present

## 2023-12-31 DIAGNOSIS — G8929 Other chronic pain: Secondary | ICD-10-CM | POA: Diagnosis not present

## 2023-12-31 DIAGNOSIS — G4733 Obstructive sleep apnea (adult) (pediatric): Secondary | ICD-10-CM | POA: Diagnosis not present

## 2023-12-31 DIAGNOSIS — K7469 Other cirrhosis of liver: Secondary | ICD-10-CM | POA: Diagnosis not present

## 2023-12-31 DIAGNOSIS — E785 Hyperlipidemia, unspecified: Secondary | ICD-10-CM | POA: Diagnosis not present

## 2023-12-31 DIAGNOSIS — K7682 Hepatic encephalopathy: Secondary | ICD-10-CM | POA: Diagnosis not present

## 2024-01-04 DIAGNOSIS — M47816 Spondylosis without myelopathy or radiculopathy, lumbar region: Secondary | ICD-10-CM | POA: Diagnosis not present

## 2024-01-04 DIAGNOSIS — K5903 Drug induced constipation: Secondary | ICD-10-CM | POA: Diagnosis not present

## 2024-01-04 DIAGNOSIS — G4700 Insomnia, unspecified: Secondary | ICD-10-CM | POA: Diagnosis not present

## 2024-01-04 DIAGNOSIS — G894 Chronic pain syndrome: Secondary | ICD-10-CM | POA: Diagnosis not present
